# Patient Record
Sex: Female | Born: 1955
Health system: Southern US, Community
[De-identification: ages and names within clinical notes are randomized; demographics above are authoritative.]

## PROBLEM LIST (undated history)

## (undated) DIAGNOSIS — K219 Gastro-esophageal reflux disease without esophagitis: Secondary | ICD-10-CM

## (undated) DIAGNOSIS — C801 Malignant (primary) neoplasm, unspecified: Secondary | ICD-10-CM

## (undated) DIAGNOSIS — T7840XA Allergy, unspecified, initial encounter: Secondary | ICD-10-CM

## (undated) DIAGNOSIS — J45909 Unspecified asthma, uncomplicated: Secondary | ICD-10-CM

## (undated) DIAGNOSIS — J42 Unspecified chronic bronchitis: Secondary | ICD-10-CM

## (undated) DIAGNOSIS — T8859XA Other complications of anesthesia, initial encounter: Secondary | ICD-10-CM

## (undated) DIAGNOSIS — Z9889 Other specified postprocedural states: Secondary | ICD-10-CM

## (undated) DIAGNOSIS — G43909 Migraine, unspecified, not intractable, without status migrainosus: Secondary | ICD-10-CM

## (undated) DIAGNOSIS — R0600 Dyspnea, unspecified: Secondary | ICD-10-CM

## (undated) DIAGNOSIS — Z5189 Encounter for other specified aftercare: Secondary | ICD-10-CM

## (undated) DIAGNOSIS — R112 Nausea with vomiting, unspecified: Secondary | ICD-10-CM

## (undated) HISTORY — PX: OTHER SURGICAL HISTORY: SHX169

## (undated) HISTORY — DX: Unspecified chronic bronchitis: J42

## (undated) HISTORY — DX: Migraine, unspecified, not intractable, without status migrainosus: G43.909

## (undated) HISTORY — PX: CERVICAL FUSION: SHX112

## (undated) HISTORY — DX: Encounter for other specified aftercare: Z51.89

## (undated) HISTORY — PX: BACK SURGERY: SHX140

## (undated) HISTORY — DX: Allergy, unspecified, initial encounter: T78.40XA

## (undated) HISTORY — DX: Unspecified asthma, uncomplicated: J45.909

## (undated) HISTORY — DX: Gastro-esophageal reflux disease without esophagitis: K21.9

---

## 2005-06-23 ENCOUNTER — Ambulatory Visit: Payer: Self-pay | Admitting: Anesthesiology

## 2005-06-25 ENCOUNTER — Ambulatory Visit: Payer: Self-pay | Admitting: Anesthesiology

## 2005-08-19 ENCOUNTER — Ambulatory Visit: Payer: Self-pay | Admitting: Anesthesiology

## 2005-09-29 ENCOUNTER — Ambulatory Visit: Payer: Self-pay | Admitting: Anesthesiology

## 2005-10-23 ENCOUNTER — Ambulatory Visit: Payer: Self-pay | Admitting: Anesthesiology

## 2005-11-24 ENCOUNTER — Ambulatory Visit: Payer: Self-pay | Admitting: Anesthesiology

## 2005-12-23 ENCOUNTER — Ambulatory Visit: Payer: Self-pay | Admitting: Anesthesiology

## 2006-01-19 ENCOUNTER — Ambulatory Visit: Payer: Self-pay | Admitting: Anesthesiology

## 2006-02-17 ENCOUNTER — Ambulatory Visit: Payer: Self-pay | Admitting: Anesthesiology

## 2006-02-23 ENCOUNTER — Ambulatory Visit: Payer: Self-pay | Admitting: Anesthesiology

## 2006-02-26 ENCOUNTER — Ambulatory Visit: Payer: Self-pay | Admitting: Anesthesiology

## 2006-11-02 ENCOUNTER — Encounter: Payer: Self-pay | Admitting: Family Medicine

## 2006-11-20 ENCOUNTER — Encounter: Payer: Self-pay | Admitting: Family Medicine

## 2006-12-21 ENCOUNTER — Encounter: Payer: Self-pay | Admitting: Family Medicine

## 2007-01-20 ENCOUNTER — Encounter: Payer: Self-pay | Admitting: Family Medicine

## 2007-02-20 ENCOUNTER — Encounter: Payer: Self-pay | Admitting: Family Medicine

## 2007-03-22 ENCOUNTER — Encounter: Payer: Self-pay | Admitting: Family Medicine

## 2007-04-22 ENCOUNTER — Encounter: Payer: Self-pay | Admitting: Family Medicine

## 2008-09-21 HISTORY — PX: BACK SURGERY: SHX140

## 2010-03-03 ENCOUNTER — Ambulatory Visit: Payer: Self-pay | Admitting: Internal Medicine

## 2011-01-22 ENCOUNTER — Emergency Department: Payer: Self-pay | Admitting: Emergency Medicine

## 2011-02-23 ENCOUNTER — Ambulatory Visit: Payer: Self-pay | Admitting: Otolaryngology

## 2011-09-21 ENCOUNTER — Ambulatory Visit: Payer: Self-pay | Admitting: Orthopedic Surgery

## 2011-10-09 ENCOUNTER — Ambulatory Visit: Payer: Self-pay | Admitting: Orthopedic Surgery

## 2011-10-09 LAB — BASIC METABOLIC PANEL
BUN: 10 mg/dL (ref 7–18)
Chloride: 104 mmol/L (ref 98–107)
EGFR (Non-African Amer.): >60
Glucose: 101 mg/dL — ABNORMAL HIGH (ref 65–99)
Osmolality: 286 (ref 275–301)
Potassium: 4 mmol/L (ref 3.5–5.1)
Sodium: 144 mmol/L (ref 136–145)

## 2011-10-09 LAB — CBC
HCT: 41.4 % (ref 35.0–47.0)
MCHC: 33.9 g/dL (ref 32.0–36.0)
Platelet: 220 10*3/uL (ref 150–440)
RBC: 4.59 10*6/uL (ref 3.80–5.20)
RDW: 13.8 % (ref 11.5–14.5)
WBC: 7.3 10*3/uL (ref 3.6–11.0)

## 2011-10-09 LAB — APTT: Activated PTT: 31.4 secs (ref 23.6–35.9)

## 2011-10-09 LAB — SEDIMENTATION RATE: Erythrocyte Sed Rate: 2 mm/hr (ref 0–30)

## 2011-10-14 ENCOUNTER — Ambulatory Visit: Payer: Self-pay | Admitting: Orthopedic Surgery

## 2012-11-22 DIAGNOSIS — Z85828 Personal history of other malignant neoplasm of skin: Secondary | ICD-10-CM | POA: Insufficient documentation

## 2013-09-21 HISTORY — PX: ESOPHAGOGASTRODUODENOSCOPY: SHX1529

## 2014-01-03 ENCOUNTER — Ambulatory Visit: Payer: Self-pay | Admitting: Physical Medicine and Rehabilitation

## 2014-01-09 ENCOUNTER — Ambulatory Visit: Payer: Self-pay | Admitting: Internal Medicine

## 2014-02-16 ENCOUNTER — Ambulatory Visit: Payer: Self-pay | Admitting: Physical Medicine and Rehabilitation

## 2014-05-09 ENCOUNTER — Encounter: Payer: Self-pay | Admitting: Orthopedic Surgery

## 2014-05-22 ENCOUNTER — Encounter: Payer: Self-pay | Admitting: Orthopedic Surgery

## 2014-12-26 ENCOUNTER — Ambulatory Visit
Admit: 2014-12-26 | Disposition: A | Discharge status: Home / Self Care | Payer: Self-pay | Attending: Internal Medicine | Admitting: Internal Medicine

## 2015-01-03 ENCOUNTER — Ambulatory Visit
Admit: 2015-01-03 | Disposition: A | Discharge status: Home / Self Care | Payer: Self-pay | Attending: Internal Medicine | Admitting: Internal Medicine

## 2015-07-21 ENCOUNTER — Encounter: Payer: Self-pay | Admitting: Internal Medicine

## 2015-07-21 ENCOUNTER — Other Ambulatory Visit: Payer: Self-pay | Admitting: Internal Medicine

## 2015-07-21 DIAGNOSIS — K219 Gastro-esophageal reflux disease without esophagitis: Secondary | ICD-10-CM | POA: Insufficient documentation

## 2015-07-21 DIAGNOSIS — M5136 Other intervertebral disc degeneration, lumbar region: Secondary | ICD-10-CM | POA: Insufficient documentation

## 2015-07-21 DIAGNOSIS — J4520 Mild intermittent asthma, uncomplicated: Secondary | ICD-10-CM | POA: Insufficient documentation

## 2015-07-21 DIAGNOSIS — R1310 Dysphagia, unspecified: Secondary | ICD-10-CM | POA: Insufficient documentation

## 2015-07-21 DIAGNOSIS — M7070 Other bursitis of hip, unspecified hip: Secondary | ICD-10-CM | POA: Insufficient documentation

## 2015-08-06 ENCOUNTER — Other Ambulatory Visit: Payer: Self-pay | Admitting: Nurse Practitioner

## 2015-08-06 ENCOUNTER — Ambulatory Visit
Admission: RE | Admit: 2015-08-06 | Discharge: 2015-08-06 | Disposition: A | Discharge status: Home / Self Care | Payer: BLUE CROSS/BLUE SHIELD | Source: Ambulatory Visit | Attending: Nurse Practitioner | Admitting: Nurse Practitioner

## 2015-08-06 DIAGNOSIS — Z1231 Encounter for screening mammogram for malignant neoplasm of breast: Secondary | ICD-10-CM

## 2015-08-06 HISTORY — DX: Malignant (primary) neoplasm, unspecified: C80.1

## 2015-08-23 ENCOUNTER — Ambulatory Visit: Payer: BLUE CROSS/BLUE SHIELD | Attending: Internal Medicine | Admitting: Physical Therapy

## 2015-08-23 ENCOUNTER — Ambulatory Visit: Payer: BLUE CROSS/BLUE SHIELD | Admitting: Physical Therapy

## 2016-01-06 ENCOUNTER — Emergency Department
Admission: EM | Admit: 2016-01-06 | Discharge: 2016-01-06 | Disposition: A | Discharge status: Home / Self Care | Payer: BLUE CROSS/BLUE SHIELD | Attending: Emergency Medicine | Admitting: Emergency Medicine

## 2016-01-06 ENCOUNTER — Ambulatory Visit
Admission: EM | Admit: 2016-01-06 | Discharge: 2016-01-06 | Disposition: A | Discharge status: Hospice | Payer: BLUE CROSS/BLUE SHIELD | Attending: Emergency Medicine | Admitting: Emergency Medicine

## 2016-01-06 ENCOUNTER — Emergency Department: Payer: BLUE CROSS/BLUE SHIELD

## 2016-01-06 ENCOUNTER — Encounter: Payer: Self-pay | Admitting: Emergency Medicine

## 2016-01-06 DIAGNOSIS — S68122A Partial traumatic metacarpophalangeal amputation of right middle finger, initial encounter: Secondary | ICD-10-CM | POA: Diagnosis not present

## 2016-01-06 DIAGNOSIS — Y999 Unspecified external cause status: Secondary | ICD-10-CM | POA: Diagnosis not present

## 2016-01-06 DIAGNOSIS — Z85828 Personal history of other malignant neoplasm of skin: Secondary | ICD-10-CM | POA: Insufficient documentation

## 2016-01-06 DIAGNOSIS — S61312A Laceration without foreign body of right middle finger with damage to nail, initial encounter: Secondary | ICD-10-CM | POA: Diagnosis not present

## 2016-01-06 DIAGNOSIS — S6991XA Unspecified injury of right wrist, hand and finger(s), initial encounter: Secondary | ICD-10-CM | POA: Diagnosis present

## 2016-01-06 DIAGNOSIS — Y929 Unspecified place or not applicable: Secondary | ICD-10-CM | POA: Diagnosis not present

## 2016-01-06 DIAGNOSIS — X58XXXA Exposure to other specified factors, initial encounter: Secondary | ICD-10-CM | POA: Diagnosis not present

## 2016-01-06 DIAGNOSIS — S68629A Partial traumatic transphalangeal amputation of unspecified finger, initial encounter: Secondary | ICD-10-CM | POA: Diagnosis not present

## 2016-01-06 DIAGNOSIS — S68129A Partial traumatic metacarpophalangeal amputation of unspecified finger, initial encounter: Secondary | ICD-10-CM

## 2016-01-06 DIAGNOSIS — Y939 Activity, unspecified: Secondary | ICD-10-CM | POA: Insufficient documentation

## 2016-01-06 DIAGNOSIS — S68112A Complete traumatic metacarpophalangeal amputation of right middle finger, initial encounter: Secondary | ICD-10-CM | POA: Diagnosis not present

## 2016-01-06 DIAGNOSIS — S68119A Complete traumatic metacarpophalangeal amputation of unspecified finger, initial encounter: Secondary | ICD-10-CM

## 2016-01-06 MED ORDER — CEPHALEXIN 500 MG PO CAPS
500.0000 mg | ORAL_CAPSULE | Freq: Once | ORAL | Status: AC
  Administered 2016-01-06: 500 mg via ORAL
  Filled 2016-01-06: qty 1

## 2016-01-06 MED ORDER — OXYCODONE-ACETAMINOPHEN 5-325 MG PO TABS: 1.0000 | ORAL_TABLET | ORAL | Status: DC | PRN

## 2016-01-06 MED ORDER — TETANUS-DIPHTH-ACELL PERTUSSIS 5-2.5-18.5 LF-MCG/0.5 IM SUSP
0.5000 mL | Freq: Once | INTRAMUSCULAR | Status: AC
  Administered 2016-01-06: 0.5 mL via INTRAMUSCULAR

## 2016-01-06 MED ORDER — OXYCODONE-ACETAMINOPHEN 5-325 MG PO TABS
1.0000 | ORAL_TABLET | Freq: Once | ORAL | Status: AC
  Administered 2016-01-06: 1 via ORAL
  Filled 2016-01-06: qty 1

## 2016-01-06 MED ORDER — OXYCODONE-ACETAMINOPHEN 5-325 MG PO TABS
1.0000 | ORAL_TABLET | Freq: Once | ORAL | Status: AC
  Administered 2016-01-06: 1 via ORAL

## 2016-01-06 MED ORDER — CEPHALEXIN 500 MG PO CAPS: 500.0000 mg | ORAL_CAPSULE | Freq: Two times a day (BID) | ORAL | Status: DC

## 2016-01-06 MED ORDER — IBUPROFEN 600 MG PO TABS
600.0000 mg | ORAL_TABLET | Freq: Once | ORAL | Status: AC
  Administered 2016-01-06: 600 mg via ORAL
  Filled 2016-01-06: qty 1

## 2016-01-06 NOTE — Discharge Instructions (Signed)
You were evaluated for fingertip amputation/avulsion, and the finger was wrapped. Leave wrapped until seen in follow up.  You should follow-up with a hand surgeon, but I expect the fingertip will heal in without any additional surgical treatment.  Return to emergency department for any worsening pain, signs of infection such as redness, swelling, or foul drainage.  You may take over-the-counter ibuprofen in addition to the prescription pain medication as needed for pain.   Traumatic Finger Amputation A traumatic finger amputation is when you lose part or all of a finger because of an accident or injury. The severity of this type of injury can vary widely. It can mean that just the tip of your finger gets ripped off (avulsion), or it can mean that you completely lose a finger (amputation). Traumatic finger amputation is a medical emergency. It requires immediate care to prevent further damage and to save the finger. CAUSES A traumatic finger amputation usually results from an accident that involves:  A car.  Power tools.  Factory work.  Farm or Company secretary. SYMPTOMS  Symptoms of a traumatic finger amputation include:  Bleeding.  Pain.  Damage to surrounding tissues, such as bones, muscles, tendons, and skin. Severe injuries can sometimes lead to shock, which is a life-threatening condition in which your body fails to get blood, oxygen, and nutrients to vital organs. Some common symptoms of shock include low blood pressure, sweating, weakness, and pale skin. DIAGNOSIS Your health care provider will do a physical exam and ask for details about how your injury occurred. The physical exam will help your health care provider to determine the severity of the injury and the best way to repair it. X-rays may be done to check for damage to the surrounding bones and tissues. TREATMENT Treatment depends on the type of injury that you have and how bad it is.  Your health care provider will  clean the wound thoroughly and apply a medicine (anesthetic) to relieve pain.  If just the tip of your finger was removed, the wound will typically heal on its own with a protective bandage (dressing) and regular cleaning.  For more severe injuries, a portion of skin may need to be taken from another part of the body (graft) and attached to the wound site until the wound heals.  If a large portion of the finger was amputated, it may be possible to reattach it surgically (replantation). HOME CARE INSTRUCTIONS  Take medicines only as directed by your health care provider.  If you were prescribed an antibiotic medicine, finish all of it even if you start to feel better.  There are many different ways to close and cover a wound, including stitches (sutures), skin glue, and adhesive strips. Follow your health care provider's instructions about:  Wound care.  Dressing changes and removal.  Wound closure removal.  Check your wound every day for signs of infection. Watch for:  Redness, swelling, or pain.  Fluid, blood, or pus.  Do exercises to strengthen your finger and hand as directed by your health care provider. SEEK MEDICAL CARE IF:  Your wound does not seem to be healing well.  You have redness, swelling, or pain at your wound site.  You have fluid, blood, or pus coming from your wound.  You have a fever.   This information is not intended to replace advice given to you by your health care provider. Make sure you discuss any questions you have with your health care provider.   Document Released: 11/16/2001  Document Revised: 09/28/2014 Document Reviewed: 05/23/2014 Elsevier Interactive Patient Education [employer].

## 2016-01-06 NOTE — ED Notes (Signed)
Amputation of distal right rd finger.

## 2016-01-06 NOTE — ED Provider Notes (Signed)
[facility]  ____________________________________________  Time seen: Approximately 12:36 PM  I have reviewed the triage vital signs and the nursing notes.   HISTORY  Chief Complaint Finger Injury   HPI Kristin Watkins is a 60 y.o. female  presents for right hand third middle finger laceration. Reports injury occurred just prior to arrival. Reports right hand dominant. States she was using a planer tool working on her floors at home, and states her glove "got caught" in the tool and and cut her right third finger. Denies other pain or injury. States unsure of last tetanus immunization.   No LMP recorded. Patient is postmenopausal.    Past Medical History  Diagnosis Date  . Cancer [facility])     skin ca    Patient Active Problem List   Diagnosis Date Noted  . Bursitis of hip 07/21/2015  . Degeneration of intervertebral disc of lumbar region 07/21/2015  . Dysphagia 07/21/2015  . Asthma, mild intermittent 07/21/2015  . Acid reflux 07/21/2015    Past Surgical History  Procedure Laterality Date  . Back surgery      spinal stimulator  . Esophagogastroduodenoscopy  2015    normal    Current Outpatient Rx  Name  Route  Sig  Dispense  Refill  . albuterol (PROAIR HFA) 108 (90 BASE) MCG/ACT inhaler   Inhalation   Inhale into the lungs.         . celecoxib (CELEBREX) 100 MG capsule   Oral   Take 1 capsule by mouth 2 (two) times daily.         . diazepam (VALIUM) 2 MG tablet   Oral   Take by mouth.         . Diclofenac Potassium (CAMBIA) 50 MG PACK   Oral   Take 1 packet by mouth daily.         . diclofenac sodium (VOLTAREN) 1 % GEL   Transdermal   Place onto the skin.         . Estradiol (ELESTRIN) 0.52 MG/0.87 GM (0.06%) GEL   Transdermal   Place onto the skin.         Marland Kitchen gabapentin (NEURONTIN) 600 MG tablet   Oral   Take 2 tablets by mouth at bedtime.         . metFORMIN (GLUCOPHAGE) 500 MG tablet   Oral   Take 1 tablet by mouth  daily.         . mometasone (NASONEX) 50 MCG/ACT nasal spray   Nasal   Place into the nose.         Marland Kitchen Omeprazole 20 MG TBEC   Oral   Take 1 tablet by mouth 2 (two) times daily.         .           . progesterone (PROMETRIUM) 200 MG capsule   Oral   Take 1 capsule by mouth daily.           Allergies Review of patient's allergies indicates no known allergies.  Family History  Problem Relation Age of Onset  . Diabetes Father   . Renal cancer Father     Social History Social History  Substance Use Topics  . Smoking status: Never Smoker   . Smokeless tobacco: Not on file  . Alcohol Use: 0.0 oz/week    0 Standard drinks or equivalent per week    Review of Systems Constitutional: No fever/chills Eyes: No visual changes. ENT: No sore throat. Cardiovascular: Denies  chest pain. Respiratory: Denies shortness of breath. Gastrointestinal: No abdominal pain.  No nausea, no vomiting.  No diarrhea.  No constipation. Genitourinary: Negative for dysuria. Musculoskeletal: Negative for back pain. Skin: Negative for rash.Right third finger laceration.  Neurological: Negative for headaches, focal weakness or numbness.  10-point ROS otherwise negative.  ____________________________________________   PHYSICAL EXAM:  VITAL SIGNS: ED Triage Vitals  Enc Vitals Group     BP 01/06/16 1215 156/99 mmHg     Pulse Rate 01/06/16 1215 111     Resp 01/06/16 1215 16     Temp --      Temp src --      SpO2 01/06/16 1215 97 %     Weight --      Height --      Head Cir --      Peak Flow --      Pain Score --      Pain Loc --      Pain Edu? --      Excl. in [place]? --    Constitutional: Alert and oriented. Well appearing and in no acute distress. Eyes: Conjunctivae are normal. PERRL. EOMI. Head: Atraumatic. Cardiovascular: Normal rate, regular rhythm. Grossly normal heart sounds.  Good peripheral circulation. Respiratory: Normal respiratory effort.  No retractions. Lungs  CTAB. Gastrointestinal: Soft and nontender.  Musculoskeletal: No lower or upper extremity tenderness nor edema.  See skin below.  Neurologic:  Normal speech and language. No gross focal neurologic deficits are appreciated. No gait instability. Skin:  Skin is warm, dry and intact. No rash noted. Except: Right third distal finger laceration through proximal nailbed with appearance of bone visible, volar surface of distal right third finger appears generally intact, nail and skin missing. Volar sensation of right third finger intact. Full range of motion to right third finger with good strength. No foreign bodies visualized. Right hand otherwise nontender and skin appears intact. Actively bleeding at laceration site.  Psychiatric: Mood and affect are normal. Speech and behavior are normal.  ____________________________________________   LABS (all labs ordered are listed, but only abnormal results are displayed)  Labs Reviewed - No data to display  PROCEDURES  Procedure(s) performed:  Dressing applied. Elevated and applied ice.  _____________________   INITIAL IMPRESSION / ASSESSMENT AND PLAN / ED COURSE  Pertinent labs & imaging results that were available during my care of the patient were reviewed by me and considered in my medical decision making (see chart for details).  Presents for complaint of right third finger laceration. Right distal third finger laceration with partial distal amputation with appearance of bone visible, suspect open fracture. Recommend patient to be treated and further evaluated emergency room of her choice. Tetanus immunization updated. One Percocet orally given prior to transfer. Recommend patient to evaluated emergency room with hand specialty. Patient states that she would prefer to go to [facility] RN charge nurse on its regional called and report given. Patient friend at bedside to drive her. Patient stable at the time of transfer and bleeding  controlled.   Discussed follow up with Primary care physician this week. Discussed follow up and return parameters including no resolution or any worsening concerns. Patient verbalized understanding and agreed to plan.   ____________________________________________   FINAL CLINICAL IMPRESSION(S) / ED DIAGNOSES  Final diagnoses:  Laceration of right middle finger w/o foreign body with damage to nail, initial encounter      Note: This dictation was prepared with Dragon dictation along with smaller phrase  technology. Any transcriptional errors that result from this process are unintentional.    Marylene Land, NP 01/06/16 1313

## 2016-01-06 NOTE — Discharge Instructions (Signed)
Go directly to Emergency room as discussed.   Laceration Care, Adult A laceration is a cut that goes through all of the layers of the skin and into the tissue that is right under the skin. Some lacerations heal on their own. Others need to be closed with stitches (sutures), staples, skin adhesive strips, or skin glue. Proper laceration care minimizes the risk of infection and helps the laceration to heal better. HOW TO CARE FOR YOUR LACERATION If sutures or staples were used:  Keep the wound clean and dry.  If you were given a bandage (dressing), you should change it at least one time per day or as told by your health care provider. You should also change it if it becomes wet or dirty.  Keep the wound completely dry for the first 24 hours or as told by your health care provider. After that time, you may shower or bathe. However, make sure that the wound is not soaked in water until after the sutures or staples have been removed.  Clean the wound one time each day or as told by your health care provider:  Wash the wound with soap and water.  Rinse the wound with water to remove all soap.  Pat the wound dry with a clean towel. Do not rub the wound.  After cleaning the wound, apply a thin layer of antibiotic ointmentas told by your health care provider. This will help to prevent infection and keep the dressing from sticking to the wound.  Have the sutures or staples removed as told by your health care provider. If skin adhesive strips were used:  Keep the wound clean and dry.  If you were given a bandage (dressing), you should change it at least one time per day or as told by your health care provider. You should also change it if it becomes dirty or wet.  Do not get the skin adhesive strips wet. You may shower or bathe, but be careful to keep the wound dry.  If the wound gets wet, pat it dry with a clean towel. Do not rub the wound.  Skin adhesive strips fall off on their own. You  may trim the strips as the wound heals. Do not remove skin adhesive strips that are still stuck to the wound. They will fall off in time. If skin glue was used:  Try to keep the wound dry, but you may briefly wet it in the shower or bath. Do not soak the wound in water, such as by swimming.  After you have showered or bathed, gently pat the wound dry with a clean towel. Do not rub the wound.  Do not do any activities that will make you sweat heavily until the skin glue has fallen off on its own.  Do not apply liquid, cream, or ointment medicine to the wound while the skin glue is in place. Using those may loosen the film before the wound has healed.  If you were given a bandage (dressing), you should change it at least one time per day or as told by your health care provider. You should also change it if it becomes dirty or wet.  If a dressing is placed over the wound, be careful not to apply tape directly over the skin glue. Doing that may cause the glue to be pulled off before the wound has healed.  Do not pick at the glue. The skin glue usually remains in place for 5-10 days, then it falls off of  the skin. General Instructions  Take over-the-counter and prescription medicines only as told by your health care provider.  If you were prescribed an antibiotic medicine or ointment, take or apply it as told by your doctor. Do not stop using it even if your condition improves.  To help prevent scarring, make sure to cover your wound with sunscreen whenever you are outside after stitches are removed, after adhesive strips are removed, or when glue remains in place and the wound is healed. Make sure to wear a sunscreen of at least 30 SPF.  Do not scratch or pick at the wound.  Keep all follow-up visits as told by your health care provider. This is important.  Check your wound every day for signs of infection. Watch for:  Redness, swelling, or pain.  Fluid, blood, or pus.  Raise (elevate)  the injured area above the level of your heart while you are sitting or lying down, if possible. SEEK MEDICAL CARE IF:  You received a tetanus shot and you have swelling, severe pain, redness, or bleeding at the injection site.  You have a fever.  A wound that was closed breaks open.  You notice a bad smell coming from your wound or your dressing.  You notice something coming out of the wound, such as wood or glass.  Your pain is not controlled with medicine.  You have increased redness, swelling, or pain at the site of your wound.  You have fluid, blood, or pus coming from your wound.  You notice a change in the color of your skin near your wound.  You need to change the dressing frequently due to fluid, blood, or pus draining from the wound.  You develop a new rash.  You develop numbness around the wound. SEEK IMMEDIATE MEDICAL CARE IF:  You develop severe swelling around the wound.  Your pain suddenly increases and is severe.  You develop painful lumps near the wound or on skin that is anywhere on your body.  You have a red streak going away from your wound.  The wound is on your hand or foot and you cannot properly move a finger or toe.  The wound is on your hand or foot and you notice that your fingers or toes look pale or bluish.   This information is not intended to replace advice given to you by your health care provider. Make sure you discuss any questions you have with your health care provider.   Document Released: 09/07/2005 Document Revised: 01/22/2015 Document Reviewed: 09/03/2014 Elsevier Interactive Patient Education 2016 [employer]  Nail Bed Injury The nail bed is the soft tissue under a fingernail or toenail. Various types of injuries can occur at the nail bed. These may involve bruising or bleeding under the nail, cuts in the nail or nail bed, or loss of the nail or a part of it. It can take several months for a damaged nail to regrow. In some  cases, the nail may not grow back normally. HOME CARE  Raise (elevate) the injured part to lessen pain and puffiness (swelling).   For an injured toenail, lie with your leg on pillows. Avoid walking or letting your leg dangle. When you walk, wear an open-toe shoe.   For an injured fingernail, keep your hand above the level of your heart. Use pillows on a table or on the arm of your chair while sitting. Use pillows on your bed while sleeping.   Use bandages or splints as told by  your doctor.   Keep your bandage (dressing) clean and dry. Change your bandage as told by your doctor.   Only take medicine as told by your doctor.   See your doctor as needed. GET HELP RIGHT AWAY IF:   You have more pain, leaking fluid (drainage), or bleeding in the injured area.   You have redness, soreness, and puffiness (inflammation) in the injured area.  You have a fever or lasting symptoms for more than 2-3 days.  You have a fever and your symptoms suddenly get worse.  You have puffiness that spreads from your finger into your hand or from your toe into your foot.  MAKE SURE YOU:  Understand these instructions.  Will watch this condition.  Will get help right away if you are not doing well or get worse.   This information is not intended to replace advice given to you by your health care provider. Make sure you discuss any questions you have with your health care provider.   Document Released: 08/26/2009 Document Revised: 01/02/2013 Document Reviewed: 09/29/2012 Elsevier Interactive Patient Education [employer].

## 2016-01-06 NOTE — ED Notes (Addendum)
Pt sent over from [facility] for further eval of finger tip  amputation of right hand third digit. Pt recvd tetanus and had one percocet while seen at [facility]. Dressing  In place and bleeding controlled at this time.

## 2016-01-06 NOTE — ED Provider Notes (Signed)
[facility] Emergency Department Provider Note   ____________________________________________  Time seen: Approximately 3pm I have reviewed the triage vital signs and the triage nursing note.  HISTORY  Chief Complaint Hand Injury   Historian Patient  HPI Kristin Watkins is a 60 y.o. female who was sent in from Urgent care for evaluation of fingertip amputation/avulsion.  Prior to arrival, right middle finger got caught in planer and avulsed fingertip. Right hand dominant.  Pt is a Systems developer for [employer].  She received tetanus booster at urgent care.  Pain throbbing and moderate, received percocet at urgent care.    Past Medical History  Diagnosis Date  . Cancer [facility])     skin ca    Patient Active Problem List   Diagnosis Date Noted  . Bursitis of hip 07/21/2015  . Degeneration of intervertebral disc of lumbar region 07/21/2015  . Dysphagia 07/21/2015  . Asthma, mild intermittent 07/21/2015  . Acid reflux 07/21/2015    Past Surgical History  Procedure Laterality Date  . Back surgery      spinal stimulator  . Esophagogastroduodenoscopy  2015    normal    Current Outpatient Rx  Name  Route  Sig  Dispense  Refill  . albuterol (PROAIR HFA) 108 (90 BASE) MCG/ACT inhaler   Inhalation   Inhale into the lungs.         . celecoxib (CELEBREX) 100 MG capsule   Oral   Take 1 capsule by mouth 2 (two) times daily.         . cephALEXin (KEFLEX) 500 MG capsule   Oral   Take 1 capsule (500 mg total) by mouth 2 (two) times daily.   14 capsule   0   . diazepam (VALIUM) 2 MG tablet   Oral   Take by mouth.         . Diclofenac Potassium (CAMBIA) 50 MG PACK   Oral   Take 1 packet by mouth daily.         . diclofenac sodium (VOLTAREN) 1 % GEL   Transdermal   Place onto the skin.         . Estradiol (ELESTRIN) 0.52 MG/0.87 GM (0.06%) GEL   Transdermal   Place onto the skin.         Marland Kitchen gabapentin  (NEURONTIN) 600 MG tablet   Oral   Take 2 tablets by mouth at bedtime.         . metFORMIN (GLUCOPHAGE) 500 MG tablet   Oral   Take 1 tablet by mouth daily.         . mometasone (NASONEX) 50 MCG/ACT nasal spray   Nasal   Place into the nose.         Marland Kitchen Omeprazole 20 MG TBEC   Oral   Take 1 tablet by mouth 2 (two) times daily.         Marland Kitchen oxyCODONE-acetaminophen (ROXICET) 5-325 MG tablet   Oral   Take 1 tablet by mouth every 4 (four) hours as needed for severe pain.   10 tablet   0   . progesterone (PROMETRIUM) 200 MG capsule   Oral   Take 1 capsule by mouth daily.           Allergies Review of patient's allergies indicates no known allergies.  Family History  Problem Relation Age of Onset  . Diabetes Father   . Renal cancer Father     Social History Social History  Substance Use Topics  . Smoking status: Never Smoker   . Smokeless tobacco: None  . Alcohol Use: 0.0 oz/week    0 Standard drinks or equivalent per week    Review of Systems  Constitutional: No other injuries Eyes:  ENT:  Cardiovascular:  Respiratory:  Gastrointestinal:  Genitourinary:  Musculoskeletal: able to bend at joints of the finger Skin: Negative for rash Neurological: no numbness or tingling other than crush site, right middle finger  ____________________________________________   PHYSICAL EXAM:  VITAL SIGNS: ED Triage Vitals  Enc Vitals Group     BP 01/06/16 1301 157/83 mmHg     Pulse Rate 01/06/16 1301 99     Resp 01/06/16 1301 20     Temp 01/06/16 1301 98.1 F (36.7 C)     Temp Source 01/06/16 1301 Oral     SpO2 01/06/16 1301 95 %     Weight 01/06/16 1301 150 lb (68.04 kg)     Height 01/06/16 1301 5\' 5"  (1.651 m)     Head Cir --      Peak Flow --      Pain Score 01/06/16 1302 10     Pain Loc --      Pain Edu? --      Excl. in [place]? --      Constitutional: Alert and oriented. Well appearing and in no distress. HEENT   Head: Normocephalic and  atraumatic.      Eyes: Conjunctivae are normal. PERRL. Normal extraocular movements.      Ears:         Nose: No congestion/rhinnorhea.   Mouth/Throat: Mucous membranes are moist.   Neck: No stridor. Cardiovascular/Chest:  Respiratory: Normal respiratory effort without tachypnea. Gastrointestinal:   Genitourinary/rectal: Musculoskeletal: Right middle fingertip partial amputation obliquely tissue missing dorsally.  Fingertip crush injury appears dusky at the remaining area of the fingertip pulp.  Partial nail amputation, without injury to germinal matrix.  Amputation at tuft, able to move DIP and all other joints of the hand Neurologic:  Normal speech and language. No gross or focal neurologic deficits are appreciated. Skin:  Skin is warm, dry and intact. No rash noted. Psychiatric: Mood and affect are normal. Speech and behavior are normal. Patient exhibits appropriate insight and judgment.  ____________________________________________   EKG I, Lisa Roca, MD, the attending physician have personally viewed and interpreted all ECGs.  none ____________________________________________  LABS (pertinent positives/negatives)  None  ____________________________________________  RADIOLOGY All Xrays were viewed by me. Imaging interpreted by Radiologist.  Right middle finger:  IMPRESSION: 1. Absence of the tuft of the distal phalanx of the right middle finger compatible with amputation. It is difficult to tell how much of the surrounding soft tissues are amputated along with the loss of the tuft ; the soft tissues are not truncated directly at the bony margin. Surrounding bandaging noted. 2. Degenerative findings at the first carpometacarpal articulation. __________________________________________  PROCEDURES  Procedure(s) performed: Washout and splinting of right middle fingertip amputation.  Performed by myself, Dr. Reita Cliche M.D.  Betadine/saline solution irrigation of  injury.  Possible tiny amount of bone visualized.  Good cap refill to germinal matrix area of remaining nail.  Dusky discoloration to distal soft tissues of the tuft/fingerpad area.  Xeroform wrapped followed by dry gauze and aluminum fingersplint.  Critical Care performed: None  ____________________________________________   ED COURSE / ASSESSMENT AND PLAN  Pertinent labs & imaging results that were available during my care of the patient were reviewed by me and considered  in my medical decision making (see chart for details).   Her x-ray shows distal phalanx tuft amputation. On clinical exam there is no significant bony protrusion, and a fair amount of soft tissue is still there, although some of it appears dusky. I discussed with her the viability of that tissue was questionable, but for now we will let it started to heal and have her follow-up with her hand surgeon.  I don't think she needs emergency hand surgery.  CONSULTATIONS:   None  Patient / Family / Caregiver informed of clinical course, medical decision-making process, and agree with plan.   I discussed return precautions, follow-up instructions, and discharged instructions with patient and/or family.   ___________________________________________   FINAL CLINICAL IMPRESSION(S) / ED DIAGNOSES   Final diagnoses:  Fingertip amputation, initial encounter              Note: This dictation was prepared with Dragon dictation. Any transcriptional errors that result from this process are unintentional   Lisa Roca, MD 01/06/16 1554

## 2016-01-06 NOTE — ED Notes (Signed)
Pt got right third digit caught in planter box, fingernail is off with some flesh from the tip of finger, finger is oozing blood

## 2016-01-08 DIAGNOSIS — S68119A Complete traumatic metacarpophalangeal amputation of unspecified finger, initial encounter: Secondary | ICD-10-CM | POA: Diagnosis not present

## 2016-01-08 DIAGNOSIS — S62632B Displaced fracture of distal phalanx of right middle finger, initial encounter for open fracture: Secondary | ICD-10-CM | POA: Diagnosis not present

## 2016-01-15 DIAGNOSIS — Z4789 Encounter for other orthopedic aftercare: Secondary | ICD-10-CM | POA: Diagnosis not present

## 2016-01-15 DIAGNOSIS — S68114D Complete traumatic metacarpophalangeal amputation of right ring finger, subsequent encounter: Secondary | ICD-10-CM | POA: Diagnosis not present

## 2016-01-15 DIAGNOSIS — M79644 Pain in right finger(s): Secondary | ICD-10-CM | POA: Diagnosis not present

## 2016-01-15 DIAGNOSIS — S62632D Displaced fracture of distal phalanx of right middle finger, subsequent encounter for fracture with routine healing: Secondary | ICD-10-CM | POA: Diagnosis not present

## 2016-01-20 DIAGNOSIS — S62632D Displaced fracture of distal phalanx of right middle finger, subsequent encounter for fracture with routine healing: Secondary | ICD-10-CM | POA: Diagnosis not present

## 2016-01-20 DIAGNOSIS — Z4789 Encounter for other orthopedic aftercare: Secondary | ICD-10-CM | POA: Diagnosis not present

## 2016-01-20 DIAGNOSIS — M79644 Pain in right finger(s): Secondary | ICD-10-CM | POA: Diagnosis not present

## 2016-01-20 DIAGNOSIS — S68119D Complete traumatic metacarpophalangeal amputation of unspecified finger, subsequent encounter: Secondary | ICD-10-CM | POA: Diagnosis not present

## 2016-02-24 DIAGNOSIS — S62632D Displaced fracture of distal phalanx of right middle finger, subsequent encounter for fracture with routine healing: Secondary | ICD-10-CM | POA: Diagnosis not present

## 2016-02-24 DIAGNOSIS — Z4789 Encounter for other orthopedic aftercare: Secondary | ICD-10-CM | POA: Diagnosis not present

## 2016-03-11 DIAGNOSIS — Z872 Personal history of diseases of the skin and subcutaneous tissue: Secondary | ICD-10-CM | POA: Diagnosis not present

## 2016-03-11 DIAGNOSIS — D485 Neoplasm of uncertain behavior of skin: Secondary | ICD-10-CM | POA: Diagnosis not present

## 2016-03-11 DIAGNOSIS — Z08 Encounter for follow-up examination after completed treatment for malignant neoplasm: Secondary | ICD-10-CM | POA: Diagnosis not present

## 2016-03-11 DIAGNOSIS — Z85828 Personal history of other malignant neoplasm of skin: Secondary | ICD-10-CM | POA: Diagnosis not present

## 2016-03-11 DIAGNOSIS — Z1283 Encounter for screening for malignant neoplasm of skin: Secondary | ICD-10-CM | POA: Diagnosis not present

## 2016-04-07 DIAGNOSIS — L570 Actinic keratosis: Secondary | ICD-10-CM | POA: Diagnosis not present

## 2016-04-07 DIAGNOSIS — C44319 Basal cell carcinoma of skin of other parts of face: Secondary | ICD-10-CM | POA: Diagnosis not present

## 2016-04-10 DIAGNOSIS — M7541 Impingement syndrome of right shoulder: Secondary | ICD-10-CM | POA: Diagnosis not present

## 2016-04-10 DIAGNOSIS — M7061 Trochanteric bursitis, right hip: Secondary | ICD-10-CM | POA: Diagnosis not present

## 2016-05-07 DIAGNOSIS — M5030 Other cervical disc degeneration, unspecified cervical region: Secondary | ICD-10-CM | POA: Diagnosis not present

## 2016-05-07 DIAGNOSIS — M791 Myalgia: Secondary | ICD-10-CM | POA: Diagnosis not present

## 2016-05-07 DIAGNOSIS — M542 Cervicalgia: Secondary | ICD-10-CM | POA: Diagnosis not present

## 2016-05-07 DIAGNOSIS — G8929 Other chronic pain: Secondary | ICD-10-CM | POA: Diagnosis not present

## 2016-05-07 DIAGNOSIS — M5136 Other intervertebral disc degeneration, lumbar region: Secondary | ICD-10-CM | POA: Diagnosis not present

## 2016-05-20 DIAGNOSIS — R7309 Other abnormal glucose: Secondary | ICD-10-CM | POA: Diagnosis not present

## 2016-05-20 DIAGNOSIS — Z6825 Body mass index (BMI) 25.0-25.9, adult: Secondary | ICD-10-CM | POA: Diagnosis not present

## 2016-05-20 DIAGNOSIS — G4709 Other insomnia: Secondary | ICD-10-CM | POA: Diagnosis not present

## 2016-05-20 DIAGNOSIS — N959 Unspecified menopausal and perimenopausal disorder: Secondary | ICD-10-CM | POA: Diagnosis not present

## 2016-09-07 DIAGNOSIS — Z85828 Personal history of other malignant neoplasm of skin: Secondary | ICD-10-CM | POA: Diagnosis not present

## 2016-09-07 DIAGNOSIS — Z1283 Encounter for screening for malignant neoplasm of skin: Secondary | ICD-10-CM | POA: Diagnosis not present

## 2016-09-07 DIAGNOSIS — Z872 Personal history of diseases of the skin and subcutaneous tissue: Secondary | ICD-10-CM | POA: Diagnosis not present

## 2016-09-07 DIAGNOSIS — Z08 Encounter for follow-up examination after completed treatment for malignant neoplasm: Secondary | ICD-10-CM | POA: Diagnosis not present

## 2016-09-07 DIAGNOSIS — D485 Neoplasm of uncertain behavior of skin: Secondary | ICD-10-CM | POA: Diagnosis not present

## 2016-09-09 ENCOUNTER — Encounter: Payer: Self-pay | Admitting: Internal Medicine

## 2016-09-09 ENCOUNTER — Ambulatory Visit (INDEPENDENT_AMBULATORY_CARE_PROVIDER_SITE_OTHER): Payer: BLUE CROSS/BLUE SHIELD | Admitting: Internal Medicine

## 2016-09-09 VITALS — BP 118/82 | HR 86 | Temp 97.9°F | Ht 65.0 in

## 2016-09-09 DIAGNOSIS — J40 Bronchitis, not specified as acute or chronic: Secondary | ICD-10-CM | POA: Diagnosis not present

## 2016-09-09 DIAGNOSIS — J4520 Mild intermittent asthma, uncomplicated: Secondary | ICD-10-CM | POA: Diagnosis not present

## 2016-09-09 MED ORDER — BUDESONIDE 180 MCG/ACT IN AEPB: 1.0000 | INHALATION_SPRAY | Freq: Two times a day (BID) | RESPIRATORY_TRACT | 3 refills | Status: DC

## 2016-09-09 MED ORDER — DOXYCYCLINE HYCLATE 100 MG PO TABS: 100.0000 mg | ORAL_TABLET | Freq: Two times a day (BID) | ORAL | 0 refills | Status: DC

## 2016-09-09 MED ORDER — HYDROCODONE-HOMATROPINE 5-1.5 MG/5ML PO SYRP: 5.0000 mL | ORAL_SOLUTION | Freq: Four times a day (QID) | ORAL | 0 refills | Status: DC | PRN

## 2016-09-09 MED ORDER — BENZONATATE 200 MG PO CAPS: 200.0000 mg | ORAL_CAPSULE | Freq: Two times a day (BID) | ORAL | 0 refills | Status: DC | PRN

## 2016-09-09 NOTE — Progress Notes (Signed)
Date:  09/09/2016   Name:  Kristin Watkins   DOB:  09/13/1956   MRN:  [id number]   Chief Complaint: Cough Cough  This is a new problem. The current episode started in the past 7 days. The problem has been gradually worsening. The problem occurs every few minutes. The cough is non-productive. Associated symptoms include postnasal drip, a sore throat and wheezing. Pertinent negatives include no chest pain, chills, ear pain, headaches or shortness of breath. The symptoms are aggravated by exercise and lying down. She has tried a beta-agonist inhaler and steroid inhaler for the symptoms. The treatment provided mild relief. Her past medical history is significant for asthma.    Review of Systems  Constitutional: Positive for fatigue. Negative for appetite change, chills and unexpected weight change.  HENT: Positive for postnasal drip and sore throat. Negative for ear pain, tinnitus and trouble swallowing.   Respiratory: Positive for cough, chest tightness and wheezing. Negative for shortness of breath.   Cardiovascular: Negative for chest pain, palpitations and leg swelling.  Gastrointestinal: Negative for abdominal pain.  Musculoskeletal: Negative for arthralgias.  Neurological: Negative for tremors, numbness and headaches.  Psychiatric/Behavioral: Negative for dysphoric mood.    Patient Active Problem List   Diagnosis Date Noted  . Bursitis of hip 07/21/2015  . Degeneration of intervertebral disc of lumbar region 07/21/2015  . Dysphagia 07/21/2015  . Asthma, mild intermittent 07/21/2015  . Acid reflux 07/21/2015    Prior to Admission medications   Medication Sig Start Date End Date Taking? Authorizing Provider  albuterol (PROAIR HFA) 108 (90 BASE) MCG/ACT inhaler Inhale into the lungs. 12/12/14  Yes Historical Provider, MD  budesonide (PULMICORT) 180 MCG/ACT inhaler Inhale 1 puff into the lungs 2 (two) times daily. 09/09/16  Yes Glean Hess, MD  celecoxib (CELEBREX) 100 MG  capsule Take 1 capsule by mouth 2 (two) times daily.   Yes Historical Provider, MD  diazepam (VALIUM) 2 MG tablet Take by mouth.   Yes Historical Provider, MD  Diclofenac Potassium (CAMBIA) 50 MG PACK Take 1 packet by mouth daily.   Yes Historical Provider, MD  diclofenac sodium (VOLTAREN) 1 % GEL Place onto the skin. 03/22/14  Yes Historical Provider, MD  Estradiol (ELESTRIN) 0.52 MG/0.87 GM (0.06%) GEL Place onto the skin.   Yes Historical Provider, MD  gabapentin (NEURONTIN) 600 MG tablet Take 2 tablets by mouth at bedtime.   Yes Historical Provider, MD  metFORMIN (GLUCOPHAGE) 500 MG tablet Take 1 tablet by mouth daily.   Yes Historical Provider, MD  mometasone (NASONEX) 50 MCG/ACT nasal spray Place into the nose.   Yes Historical Provider, MD  progesterone (PROMETRIUM) 200 MG capsule Take 1 capsule by mouth daily.   Yes Historical Provider, MD  rizatriptan (MAXALT) 10 MG tablet TAKE 1 (ONE) TABLET, ORAL, EVERY TWO HOURS, AS NEEDED 04/01/15  Yes Historical Provider, MD  benzonatate (TESSALON) 200 MG capsule Take 1 capsule (200 mg total) by mouth 2 (two) times daily as needed for cough. 09/09/16   Glean Hess, MD  doxycycline (VIBRA-TABS) 100 MG tablet Take 1 tablet (100 mg total) by mouth 2 (two) times daily. 09/09/16   Glean Hess, MD  HYDROcodone-homatropine [facility]) 5-1.5 MG/5ML syrup Take 5 mLs by mouth every 6 (six) hours as needed for cough. 09/09/16   Glean Hess, MD    No Known Allergies  Past Surgical History:  Procedure Laterality Date  . BACK SURGERY     spinal stimulator  . ESOPHAGOGASTRODUODENOSCOPY  2015   normal    Social History  Substance Use Topics  . Smoking status: Never Smoker  . Smokeless tobacco: Never Used  . Alcohol use 0.0 oz/week     Medication list has been reviewed and updated.   Physical Exam  Constitutional: She is oriented to person, place, and time. She appears well-developed. No distress.  HENT:  Head: Normocephalic and  atraumatic.  Right Ear: Tympanic membrane and ear canal normal.  Left Ear: Tympanic membrane and ear canal normal.  Nose: Right sinus exhibits no maxillary sinus tenderness. Left sinus exhibits no maxillary sinus tenderness.  Mouth/Throat: Posterior oropharyngeal erythema present. No oropharyngeal exudate or posterior oropharyngeal edema.  Cardiovascular: Normal rate, regular rhythm and normal heart sounds.   Pulmonary/Chest: Effort normal. No respiratory distress. She has decreased breath sounds. She has wheezes in the right middle field and the left middle field. She has no rhonchi. She has no rales.  Musculoskeletal: Normal range of motion.  Neurological: She is alert and oriented to person, place, and time.  Skin: Skin is warm and dry. No rash noted.  Psychiatric: She has a normal mood and affect. Her behavior is normal. Thought content normal.  Nursing note and vitals reviewed.   BP 118/82   Pulse 86   Temp 97.9 F (36.6 C)   Ht 5\' 5"  (1.651 m)   SpO2 97%   Assessment and Plan: 1. Bronchitis Continue albuterol and pulmicort - doxycycline (VIBRA-TABS) 100 MG tablet; Take 1 tablet (100 mg total) by mouth 2 (two) times daily.  Dispense: 20 tablet; Refill: 0 - HYDROcodone-homatropine (HYCODAN) 5-1.5 MG/5ML syrup; Take 5 mLs by mouth every 6 (six) hours as needed for cough.  Dispense: 120 mL; Refill: 0 - benzonatate (TESSALON) 200 MG capsule; Take 1 capsule (200 mg total) by mouth 2 (two) times daily as needed for cough.  Dispense: 20 capsule; Refill: 0  2. Mild intermittent asthma without complication - budesonide (PULMICORT) 180 MCG/ACT inhaler; Inhale 1 puff into the lungs 2 (two) times daily.  Dispense: 1 each; Refill: [place], MD [facility]  09/09/2016

## 2016-10-12 DIAGNOSIS — D485 Neoplasm of uncertain behavior of skin: Secondary | ICD-10-CM | POA: Diagnosis not present

## 2016-10-12 DIAGNOSIS — D229 Melanocytic nevi, unspecified: Secondary | ICD-10-CM | POA: Diagnosis not present

## 2016-10-15 ENCOUNTER — Ambulatory Visit (INDEPENDENT_AMBULATORY_CARE_PROVIDER_SITE_OTHER): Payer: BLUE CROSS/BLUE SHIELD | Admitting: Internal Medicine

## 2016-10-15 ENCOUNTER — Encounter: Payer: Self-pay | Admitting: Internal Medicine

## 2016-10-15 VITALS — BP 132/84 | HR 88 | Temp 98.0°F | Ht 65.0 in | Wt 149.0 lb

## 2016-10-15 DIAGNOSIS — J309 Allergic rhinitis, unspecified: Secondary | ICD-10-CM | POA: Diagnosis not present

## 2016-10-15 DIAGNOSIS — K219 Gastro-esophageal reflux disease without esophagitis: Secondary | ICD-10-CM | POA: Diagnosis not present

## 2016-10-15 MED ORDER — LANSOPRAZOLE 30 MG PO CPDR: 30.0000 mg | DELAYED_RELEASE_CAPSULE | Freq: Every day | ORAL | 3 refills | Status: DC

## 2016-10-15 MED ORDER — MONTELUKAST SODIUM 10 MG PO TABS: 10.0000 mg | ORAL_TABLET | Freq: Every day | ORAL | 3 refills | Status: DC

## 2016-10-15 NOTE — Progress Notes (Signed)
Date:  10/15/2016   Name:  Kristin Watkins   DOB:  19-Oct-1955   MRN:  [id number]   Chief Complaint: Sinus Problem Sinus Problem  This is a new problem. The current episode started in the past 7 days. There has been no fever. Associated symptoms include congestion. Pertinent negatives include no chills, coughing, headaches, shortness of breath, sinus pressure or swollen glands. (Mostly post nasal drip) Treatments tried: claritin, nasonex.  Gastroesophageal Reflux  She complains of dysphagia and heartburn. She reports no abdominal pain, no chest pain, no coughing or no wheezing. This is a new problem. The heartburn is located in the substernum. Pertinent negatives include no fatigue. She has tried a PPI for the symptoms. Past procedures include an EGD and a UGI. scope normal; UGI normal - omeprazole has caused diarrhea and abdominal discomfort.    Review of Systems  Constitutional: Negative for chills, fatigue and fever.  HENT: Positive for congestion and trouble swallowing. Negative for sinus pressure.   Respiratory: Negative for cough, chest tightness, shortness of breath and wheezing.   Cardiovascular: Negative for chest pain and palpitations.  Gastrointestinal: Positive for diarrhea (frequent formed stools), dysphagia and heartburn. Negative for abdominal distention, abdominal pain and constipation.  Neurological: Negative for dizziness and headaches.    Patient Active Problem List   Diagnosis Date Noted  . Bursitis of hip 07/21/2015  . Degeneration of intervertebral disc of lumbar region 07/21/2015  . Dysphagia 07/21/2015  . Asthma, mild intermittent 07/21/2015  . Acid reflux 07/21/2015    Prior to Admission medications   Medication Sig Start Date End Date Taking? Authorizing Provider  albuterol (PROAIR HFA) 108 (90 BASE) MCG/ACT inhaler Inhale into the lungs. 12/12/14  Yes Historical Provider, MD  benzonatate (TESSALON) 200 MG capsule Take 1 capsule (200 mg total) by mouth 2  (two) times daily as needed for cough. 09/09/16  Yes Glean Hess, MD  budesonide (PULMICORT) 180 MCG/ACT inhaler Inhale 1 puff into the lungs 2 (two) times daily. 09/09/16  Yes Glean Hess, MD  celecoxib (CELEBREX) 100 MG capsule Take 1 capsule by mouth 2 (two) times daily.   Yes Historical Provider, MD  diazepam (VALIUM) 2 MG tablet Take by mouth.   Yes Historical Provider, MD  Diclofenac Potassium (CAMBIA) 50 MG PACK Take 1 packet by mouth daily.   Yes Historical Provider, MD  diclofenac sodium (VOLTAREN) 1 % GEL Place onto the skin. 03/22/14  Yes Historical Provider, MD  Estradiol (ELESTRIN) 0.52 MG/0.87 GM (0.06%) GEL Place onto the skin.   Yes Historical Provider, MD  gabapentin (NEURONTIN) 600 MG tablet Take 2 tablets by mouth at bedtime.   Yes Historical Provider, MD  metFORMIN (GLUCOPHAGE) 500 MG tablet Take 1 tablet by mouth daily.   Yes Historical Provider, MD  mometasone (NASONEX) 50 MCG/ACT nasal spray Place into the nose.   Yes Historical Provider, MD  omeprazole (PRILOSEC) 10 MG capsule Take 10 mg by mouth daily.   Yes Historical Provider, MD  progesterone (PROMETRIUM) 200 MG capsule Take 1 capsule by mouth daily.   Yes Historical Provider, MD  ranitidine (ZANTAC) 75 MG/5ML syrup Take by mouth 2 (two) times daily.   Yes Historical Provider, MD  rizatriptan (MAXALT) 10 MG tablet TAKE 1 (ONE) TABLET, ORAL, EVERY TWO HOURS, AS NEEDED 04/01/15  Yes Historical Provider, MD  HYDROcodone-homatropine (HYCODAN) 5-1.5 MG/5ML syrup Take 5 mLs by mouth every 6 (six) hours as needed for cough. Patient not taking: Reported on 10/15/2016 09/09/16  Glean Hess, MD    No Known Allergies  Past Surgical History:  Procedure Laterality Date  . BACK SURGERY     spinal stimulator  . ESOPHAGOGASTRODUODENOSCOPY  2015   normal    Social History  Substance Use Topics  . Smoking status: Never Smoker  . Smokeless tobacco: Never Used  . Alcohol use 0.0 oz/week     Medication list has  been reviewed and updated.   Physical Exam  Constitutional: She is oriented to person, place, and time. She appears well-developed. No distress.  HENT:  Head: Normocephalic and atraumatic.  Right Ear: Tympanic membrane and ear canal normal.  Left Ear: Tympanic membrane and ear canal normal.  Nose: Right sinus exhibits no maxillary sinus tenderness and no frontal sinus tenderness. Left sinus exhibits no maxillary sinus tenderness and no frontal sinus tenderness.  Cardiovascular: Normal rate, regular rhythm and normal heart sounds.   Pulmonary/Chest: Effort normal and breath sounds normal. No respiratory distress.  Musculoskeletal: Normal range of motion.  Neurological: She is alert and oriented to person, place, and time.  Skin: Skin is warm and dry. No rash noted.  Psychiatric: She has a normal mood and affect. Her behavior is normal. Thought content normal.  Nursing note and vitals reviewed.   BP 132/84   Pulse 88   Temp 98 F (36.7 C)   Ht 5\' 5"  (1.651 m)   Wt 149 lb (67.6 kg)   SpO2 98%   BMI 24.79 kg/m   Assessment and Plan: 1. Gastroesophageal reflux disease without esophagitis Stop omeprazole; switch PPI Continue probiotics - lansoprazole (PREVACID) 30 MG capsule; Take 1 capsule (30 mg total) by mouth daily at 12 noon.  Dispense: 30 capsule; Refill: 3  2. Allergic rhinitis, unspecified chronicity, unspecified seasonality, unspecified trigger Add singulair Continue claritin - montelukast (SINGULAIR) 10 MG tablet; Take 1 tablet (10 mg total) by mouth at bedtime.  Dispense: 30 tablet; Refill: [place], MD [facility]  10/15/2016

## 2016-11-12 ENCOUNTER — Other Ambulatory Visit: Payer: Self-pay

## 2016-11-12 ENCOUNTER — Other Ambulatory Visit: Payer: Self-pay | Admitting: Internal Medicine

## 2016-11-12 DIAGNOSIS — K219 Gastro-esophageal reflux disease without esophagitis: Secondary | ICD-10-CM

## 2016-11-12 DIAGNOSIS — J309 Allergic rhinitis, unspecified: Secondary | ICD-10-CM

## 2016-11-12 MED ORDER — LANSOPRAZOLE 30 MG PO CPDR: 30.0000 mg | DELAYED_RELEASE_CAPSULE | Freq: Every day | ORAL | 3 refills | Status: DC

## 2016-11-12 MED ORDER — MONTELUKAST SODIUM 10 MG PO TABS: 10.0000 mg | ORAL_TABLET | Freq: Every day | ORAL | 3 refills | Status: DC

## 2016-11-13 ENCOUNTER — Other Ambulatory Visit: Payer: Self-pay | Admitting: Internal Medicine

## 2016-11-13 DIAGNOSIS — J309 Allergic rhinitis, unspecified: Secondary | ICD-10-CM

## 2016-11-13 MED ORDER — MONTELUKAST SODIUM 10 MG PO TABS: 10.0000 mg | ORAL_TABLET | Freq: Every day | ORAL | 1 refills | Status: DC

## 2016-12-03 ENCOUNTER — Telehealth: Payer: Self-pay

## 2016-12-03 ENCOUNTER — Other Ambulatory Visit: Payer: Self-pay | Admitting: Internal Medicine

## 2016-12-03 ENCOUNTER — Encounter: Payer: Self-pay | Admitting: Medical

## 2016-12-03 ENCOUNTER — Ambulatory Visit: Payer: Self-pay | Admitting: Medical

## 2016-12-03 VITALS — BP 138/90 | HR 96 | Temp 99.0°F | Resp 16 | Ht 66.0 in | Wt 150.0 lb

## 2016-12-03 DIAGNOSIS — B029 Zoster without complications: Secondary | ICD-10-CM

## 2016-12-03 MED ORDER — VALACYCLOVIR HCL 1 G PO TABS: 1000.0000 mg | ORAL_TABLET | Freq: Three times a day (TID) | ORAL | 0 refills | Status: DC

## 2016-12-03 NOTE — Telephone Encounter (Signed)
Pt called stating that she believes she is coming up with shingles. 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 Will still try to schedule appt but in mean time would like to try and get antiviral medication sent in to [place]. Need appt first?

## 2016-12-03 NOTE — Telephone Encounter (Signed)
Rx for Valtrex sent.

## 2016-12-03 NOTE — Telephone Encounter (Signed)
Informed pt. Pt said [facility] is seeing her this afternoon. If they confirm shingles, she may not come tomorrow for appt. If any questions, she will still see Dr. Army Melia tomorrow.

## 2016-12-03 NOTE — Progress Notes (Signed)
   Subjective:    Patient ID: Kristin Watkins, female    DOB: [birth year], 61 y.o.   MRN: [id number]  HPI Noticed rash yesterday, felt discomfort and itching over the weekend.  Called her doctor  Dr. Army Melia in [facility] who was unaable to see the patient today but called for the patient  in some Valtrex. But patient  wanted to get rash examined. Has pain medication at home so says she needs none. A nursing friend of hers told her it sounded like  She had shingles. 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.    Review of Systems  Constitutional: Positive for fever.  in clinic. itchty rash on right side at bra line, with discomfort.   Recent illness with URI in the fall and then a mild pneumonia around christmas time. Placed on antibiotics x 10 days and then got better. See HPI     Objective:   Physical Exam  Constitutional: She appears well-developed and well-nourished.  HENT:  Head: Normocephalic and atraumatic.  Eyes: EOM are normal. Pupils are equal, round, and reactive to light.  Skin: Skin is warm and dry. Rash noted.   blistering rash on right side with erythematous base located under braline and 2 small areas on back at same dermatome. Does not cross midline.         Assessment & Plan:  Start Valtrex today. Return to clinic if worsening or see your family doctor in the next 3-5 days. Take pain medication as needed.

## 2016-12-04 ENCOUNTER — Ambulatory Visit: Payer: BLUE CROSS/BLUE SHIELD | Admitting: Internal Medicine

## 2017-03-01 ENCOUNTER — Other Ambulatory Visit: Payer: Self-pay | Admitting: Internal Medicine

## 2017-03-17 ENCOUNTER — Ambulatory Visit (INDEPENDENT_AMBULATORY_CARE_PROVIDER_SITE_OTHER): Payer: BLUE CROSS/BLUE SHIELD | Admitting: Internal Medicine

## 2017-03-17 ENCOUNTER — Other Ambulatory Visit: Payer: Self-pay | Admitting: Internal Medicine

## 2017-03-17 ENCOUNTER — Encounter: Payer: Self-pay | Admitting: Internal Medicine

## 2017-03-17 VITALS — BP 122/72 | HR 88 | Ht 65.0 in | Wt 156.0 lb

## 2017-03-17 DIAGNOSIS — C44319 Basal cell carcinoma of skin of other parts of face: Secondary | ICD-10-CM | POA: Diagnosis not present

## 2017-03-17 DIAGNOSIS — D485 Neoplasm of uncertain behavior of skin: Secondary | ICD-10-CM | POA: Diagnosis not present

## 2017-03-17 DIAGNOSIS — K219 Gastro-esophageal reflux disease without esophagitis: Secondary | ICD-10-CM | POA: Diagnosis not present

## 2017-03-17 DIAGNOSIS — Z86018 Personal history of other benign neoplasm: Secondary | ICD-10-CM | POA: Diagnosis not present

## 2017-03-17 DIAGNOSIS — J4520 Mild intermittent asthma, uncomplicated: Secondary | ICD-10-CM | POA: Diagnosis not present

## 2017-03-17 DIAGNOSIS — Z85828 Personal history of other malignant neoplasm of skin: Secondary | ICD-10-CM | POA: Diagnosis not present

## 2017-03-17 DIAGNOSIS — L918 Other hypertrophic disorders of the skin: Secondary | ICD-10-CM | POA: Diagnosis not present

## 2017-03-17 MED ORDER — LANSOPRAZOLE 30 MG PO CPDR: 30.0000 mg | DELAYED_RELEASE_CAPSULE | Freq: Every day | ORAL | 5 refills | Status: DC

## 2017-03-17 NOTE — Progress Notes (Signed)
Date:  03/17/2017   Name:  Kristin Watkins   DOB:  1956/07/30   MRN:  [id number]   Chief Complaint: Asthma (Seems like last few weeks been using rescue inhaler more. Humidity is causing asthma to be worse. Yesterday there was donuts at front of class. Had asthma attack after they were brought into room- Coughing so bad thought was going to vomit. Had to use rescue inhaler. Had to leave class to go home and use nebulizer twice. Still having difficulty catching breath. Had to use nebulizer once this morning- Tightness in chest when happening. Headache along with post nasal drip is other symptoms. ) and Gastroesophageal Reflux (Belching all night long. Having trouble sleeping due to this. Even after eating crackers still having belching issues. Wanting medication she can take daily for GERD issues instead of PREVACID.   )    Review of Systems  Constitutional: Negative for chills, fatigue and fever.  Respiratory: Positive for chest tightness, shortness of breath and wheezing.   Cardiovascular: Negative for chest pain and palpitations.  Gastrointestinal: Negative for nausea and vomiting.       Reflux - resolved after prevacid but then read not to take indefinitely so stopped it several weeks ago  Allergic/Immunologic: Positive for environmental allergies.  Neurological: Positive for headaches. Negative for dizziness.    Patient Active Problem List   Diagnosis Date Noted  . Allergic rhinitis 10/15/2016  . Bursitis of hip 07/21/2015  . Degeneration of intervertebral disc of lumbar region 07/21/2015  . Dysphagia 07/21/2015  . Asthma, mild intermittent 07/21/2015  . Acid reflux 07/21/2015  . Personal history of other malignant neoplasm of skin 11/22/2012    Prior to Admission medications   Medication Sig Start Date End Date Taking? Authorizing Provider  budesonide (PULMICORT) 180 MCG/ACT inhaler Inhale 1 puff into the lungs 2 (two) times daily. 09/09/16  Yes Glean Hess, MD    diazepam (VALIUM) 2 MG tablet Take by mouth.   Yes [provider]  Diclofenac Potassium (CAMBIA) 50 MG PACK Take 1 packet by mouth daily.   Yes [provider]  diclofenac sodium (VOLTAREN) 1 % GEL Place onto the skin. 03/22/14  Yes [provider]  Estradiol (ELESTRIN) 0.52 MG/0.87 GM (0.06%) GEL Place onto the skin.   Yes [provider]  gabapentin (NEURONTIN) 600 MG tablet Take 150 mg by mouth at bedtime.    Yes [provider]  lansoprazole (PREVACID) 30 MG capsule TAKE 1 CAPSULE (30 MG TOTAL) BY MOUTH DAILY AT 12 NOON. 11/12/16  Yes Glean Hess, MD  montelukast (SINGULAIR) 10 MG tablet Take 1 tablet (10 mg total) by mouth at bedtime. 11/13/16  Yes Glean Hess, MD  PROAIR HFA 108 [phone] Base) MCG/ACT inhaler INHALE 2 PUFFS 4 TIMES DAILY AS NEEDED 03/01/17  Yes Glean Hess, MD  progesterone (PROMETRIUM) 200 MG capsule Take 1 capsule by mouth daily.   Yes [provider]  rizatriptan (MAXALT) 10 MG tablet TAKE 1 (ONE) TABLET, ORAL, EVERY TWO HOURS, AS NEEDED 04/01/15  Yes [provider]  Testosterone 10 MG/ACT (2%) GEL Place onto the skin.   Yes [provider]  traMADol-acetaminophen (ULTRACET) 37.5-325 MG tablet TAKE 1-2 TABLETS BY MOUTH TWICE DAILY AS NEEDED 09/24/16  Yes [provider]    No Known Allergies  Past Surgical History:  Procedure Laterality Date  . BACK SURGERY     spinal stimulator  . ESOPHAGOGASTRODUODENOSCOPY  2015   normal  Social History  Substance Use Topics  . Smoking status: Never Smoker  . Smokeless tobacco: Never Used  . Alcohol use 0.0 oz/week     Medication list has been reviewed and updated.   Physical Exam  Constitutional: She is oriented to person, place, and time. She appears well-developed. No distress.  HENT:  Head: Normocephalic and atraumatic.  Neck: Normal range of motion. Neck supple. No thyromegaly present.  Pulmonary/Chest: Effort normal. No  respiratory distress. She has no wheezes.  Musculoskeletal: Normal range of motion.  Neurological: She is alert and oriented to person, place, and time.  Skin: Skin is warm and dry. No rash noted.  Psychiatric: She has a normal mood and affect. Her behavior is normal. Thought content normal.  Nursing note and vitals reviewed.   BP 122/72   Pulse 88   Ht 5\' 5"  (1.651 m)   Wt 156 lb (70.8 kg)   SpO2 99%   BMI 25.96 kg/m   Assessment and Plan: 1. Mild intermittent asthma without complication Begin Symbicort 160/4.5 bid in place of Pulmicort for the next month Resume Prevacid Continue albuterol as needed Rx for portable travel sized nebulizer given  2. Gastroesophageal reflux disease without esophagitis - lansoprazole (PREVACID) 30 MG capsule; Take 1 capsule (30 mg total) by mouth daily at 12 noon.  Dispense: 30 capsule; Refill: 5   Meds ordered this encounter  Medications  . lansoprazole (PREVACID) 30 MG capsule    Sig: Take 1 capsule (30 mg total) by mouth daily at 12 noon.    Dispense:  30 capsule    Refill:  [place], MD [facility]  03/17/2017

## 2017-03-17 NOTE — Patient Instructions (Signed)
Resume Prevacid daily  Hold Pulmicort for 2 weeks while using Symbicort 160/4.5 - One puff twice a day.

## 2017-03-31 DIAGNOSIS — C44319 Basal cell carcinoma of skin of other parts of face: Secondary | ICD-10-CM | POA: Diagnosis not present

## 2017-05-22 ENCOUNTER — Other Ambulatory Visit: Payer: Self-pay | Admitting: Internal Medicine

## 2017-06-08 DIAGNOSIS — M7541 Impingement syndrome of right shoulder: Secondary | ICD-10-CM | POA: Diagnosis not present

## 2017-06-09 ENCOUNTER — Other Ambulatory Visit: Payer: Self-pay | Admitting: Internal Medicine

## 2017-06-09 DIAGNOSIS — J309 Allergic rhinitis, unspecified: Secondary | ICD-10-CM

## 2017-06-15 ENCOUNTER — Ambulatory Visit (INDEPENDENT_AMBULATORY_CARE_PROVIDER_SITE_OTHER): Payer: BLUE CROSS/BLUE SHIELD | Admitting: Internal Medicine

## 2017-06-15 ENCOUNTER — Encounter: Payer: Self-pay | Admitting: Internal Medicine

## 2017-06-15 VITALS — BP 140/86 | HR 93 | Ht 65.0 in | Wt 140.8 lb

## 2017-06-15 DIAGNOSIS — Z1159 Encounter for screening for other viral diseases: Secondary | ICD-10-CM | POA: Diagnosis not present

## 2017-06-15 DIAGNOSIS — K219 Gastro-esophageal reflux disease without esophagitis: Secondary | ICD-10-CM | POA: Diagnosis not present

## 2017-06-15 DIAGNOSIS — R7303 Prediabetes: Secondary | ICD-10-CM | POA: Diagnosis not present

## 2017-06-15 DIAGNOSIS — E782 Mixed hyperlipidemia: Secondary | ICD-10-CM

## 2017-06-15 DIAGNOSIS — M5136 Other intervertebral disc degeneration, lumbar region: Secondary | ICD-10-CM | POA: Diagnosis not present

## 2017-06-15 DIAGNOSIS — M51369 Other intervertebral disc degeneration, lumbar region without mention of lumbar back pain or lower extremity pain: Secondary | ICD-10-CM

## 2017-06-15 DIAGNOSIS — N951 Menopausal and female climacteric states: Secondary | ICD-10-CM | POA: Diagnosis not present

## 2017-06-15 DIAGNOSIS — Z1211 Encounter for screening for malignant neoplasm of colon: Secondary | ICD-10-CM

## 2017-06-15 MED ORDER — PROGESTERONE MICRONIZED 200 MG PO CAPS: 200.0000 mg | ORAL_CAPSULE | Freq: Every day | ORAL | 3 refills | Status: DC

## 2017-06-15 MED ORDER — ESTRADIOL 0.52 MG/0.87 GM (0.06%) TD GEL: 1.0000 "application " | Freq: Every day | TRANSDERMAL | 3 refills | Status: DC

## 2017-06-15 NOTE — Progress Notes (Signed)
Date:  06/15/2017   Name:  Kristin Watkins   DOB:  06-02-1956   MRN:  [id number]   Chief Complaint: hormone replacement  (Want to discuss getting blood work and prescribing hormone replacemnt. It currently on treatment. NP- Teague. )  Menopausal sx - she has been seen in integrative physician for a number of years for hormonal therapy. That practitioner though his been moving around frequently and she is wondering if she can get her prescriptions here. She is on topical estrogen and testosterone and oral progesterone with excellent symptom control.  Colon cancer screening - she is due for colonoscopy. Has never had one and now that she 76 she thinks this time.  Pre-diabetes - she's had several borderline A1c is over the past several years. This was previously being monitored by the alternative practitioner. Review of Systems  Constitutional: Negative for chills, fatigue, fever and unexpected weight change.  Eyes: Negative for visual disturbance.  Respiratory: Negative for chest tightness and shortness of breath.   Cardiovascular: Negative for chest pain and leg swelling.  Endocrine: Positive for polyuria. Negative for polydipsia.  Genitourinary: Negative for menstrual problem, vaginal bleeding and vaginal pain.  Musculoskeletal: Positive for arthralgias and back pain.  Skin: Negative for wound.  Allergic/Immunologic: Negative for environmental allergies.  Neurological: Negative for dizziness and headaches.  Psychiatric/Behavioral: Negative for sleep disturbance.    Patient Active Problem List   Diagnosis Date Noted  . Allergic rhinitis 10/15/2016  . Bursitis of hip 07/21/2015  . Degeneration of intervertebral disc of lumbar region 07/21/2015  . Dysphagia 07/21/2015  . Asthma, mild intermittent 07/21/2015  . Acid reflux 07/21/2015  . Personal history of other malignant neoplasm of skin 11/22/2012    Prior to Admission medications   Medication Sig  Start Date End Date Taking? Authorizing Provider  albuterol (PROAIR HFA) 108 (90 Base) MCG/ACT inhaler INHALE 2 PUFFS 4 TIMES DAILY AS NEEDED 05/22/17  Yes Glean Hess, MD  budesonide (PULMICORT) 180 MCG/ACT inhaler Inhale 1 puff into the lungs 2 (two) times daily. 09/09/16  Yes Glean Hess, MD  diazepam (VALIUM) 2 MG tablet Take by mouth.   Yes [provider]  Diclofenac Potassium (CAMBIA) 50 MG PACK Take 1 packet by mouth daily.   Yes [provider]  Estradiol (ELESTRIN) 0.52 MG/0.87 GM (0.06%) GEL Place onto the skin.   Yes [provider]  lansoprazole (PREVACID) 30 MG capsule Take 1 capsule (30 mg total) by mouth daily at 12 noon. 03/17/17  Yes Glean Hess, MD  montelukast (SINGULAIR) 10 MG tablet TAKE 1 TABLET (10 MG TOTAL) BY MOUTH AT BEDTIME. 06/09/17  Yes Glean Hess, MD  progesterone (PROMETRIUM) 200 MG capsule Take 1 capsule by mouth daily.   Yes [provider]  Testosterone 10 MG/ACT (2%) GEL Place onto the skin.   Yes [provider]  traMADol-acetaminophen (ULTRACET) 37.5-325 MG tablet TAKE 1-2 TABLETS BY MOUTH TWICE DAILY AS NEEDED 09/24/16  Yes [provider]  diclofenac sodium (VOLTAREN) 1 % GEL Place onto the skin. 03/22/14   [provider]  rizatriptan (MAXALT) 10 MG tablet TAKE 1 (ONE) TABLET, ORAL, EVERY TWO HOURS, AS NEEDED 04/01/15   [provider]    No Known Allergies  Past Surgical History:  Procedure Laterality Date  . BACK SURGERY     spinal stimulator  . ESOPHAGOGASTRODUODENOSCOPY  2015   normal    Social History  Substance Use Topics  .  Smoking status: Never Smoker  . Smokeless tobacco: Never Used  . Alcohol use 0.0 oz/week     Medication list has been reviewed and updated.  PHQ 2/9 Scores 06/15/2017  PHQ - 2 Score 0    Physical Exam  Constitutional: She is oriented to person, place, and time. She appears well-developed. No distress.  HENT:  Head:  Normocephalic and atraumatic.  Neck: Normal range of motion. Neck supple. No thyromegaly present.  Cardiovascular: Normal rate, regular rhythm and normal heart sounds.   Pulmonary/Chest: Effort normal and breath sounds normal. No respiratory distress. She has no wheezes.  Musculoskeletal: She exhibits no edema.  Neurological: She is alert and oriented to person, place, and time.  Skin: Skin is warm and dry. No rash noted.  Psychiatric: She has a normal mood and affect. Her behavior is normal. Thought content normal.  Nursing note and vitals reviewed.   BP 140/86 (BP Location: Right Arm, Patient Position: Sitting, Cuff Size: Normal)   Pulse 93   Ht 5\' 5"  (1.651 m)   Wt 140 lb 12.8 oz (63.9 kg)   SpO2 99%   BMI 23.43 kg/m   Assessment and Plan: 1. Climacteric Continue current therapy Will ask [facility] to send over fax for testosterone gel - Estradiol (ELESTRIN) 0.52 MG/0.87 GM (0.06%) GEL; Apply 1 application topically daily.  Dispense: 52 g; Refill: 3 - Comprehensive metabolic panel  2. Gastroesophageal reflux disease without esophagitis Continue PPI - Comprehensive metabolic panel - TSH  3. Degeneration of intervertebral disc of lumbar region stable  4. Colon cancer screening - Ambulatory referral to Gastroenterology  5. Mixed hyperlipidemia - Lipid panel  6. Pre-diabetes - Hemoglobin A1c  7. Need for hepatitis C screening test - Hepatitis C antibody   Meds ordered this encounter  Medications  . Estradiol (ELESTRIN) 0.52 MG/0.87 GM (0.06%) GEL    Sig: Apply 1 application topically daily.    Dispense:  52 g    Refill:  3  . progesterone (PROMETRIUM) 200 MG capsule    Sig: Take 1 capsule (200 mg total) by mouth daily.    Dispense:  90 capsule    Refill:  3    Partially dictated using Editor, commissioning. Any errors are unintentional.  Halina Maidens, MD [facility]  06/15/2017

## 2017-06-16 LAB — COMPREHENSIVE METABOLIC PANEL
ALK PHOS: 65 IU/L (ref 39–117)
ALT: 11 IU/L (ref 0–32)
AST: 15 IU/L (ref 0–40)
Albumin/Globulin Ratio: 2.1 (ref 1.2–2.2)
Albumin: 5.1 g/dL (ref 3.5–5.5)
BUN/Creatinine Ratio: 14 (ref 9–23)
BUN: 13 mg/dL (ref 6–24)
Bilirubin Total: 0.5 mg/dL (ref 0.0–1.2)
CALCIUM: 10.1 mg/dL (ref 8.7–10.2)
CO2: 25 mmol/L (ref 20–29)
CREATININE: 0.92 mg/dL (ref 0.57–1.00)
Chloride: 100 mmol/L (ref 96–106)
GFR calc Af Amer: 79 mL/min/{1.73_m2} (ref >59)
GFR, EST NON AFRICAN AMERICAN: 68 mL/min/{1.73_m2} (ref >59)
GLOBULIN, TOTAL: 2.4 g/dL (ref 1.5–4.5)
Glucose: 91 mg/dL (ref 65–99)
POTASSIUM: 4.1 mmol/L (ref 3.5–5.2)
SODIUM: 141 mmol/L (ref 134–144)
Total Protein: 7.5 g/dL (ref 6.0–8.5)

## 2017-06-16 LAB — LIPID PANEL
CHOL/HDL RATIO: 3.2 ratio (ref 0.0–4.4)
CHOLESTEROL TOTAL: 234 mg/dL — AB (ref 100–199)
HDL: 73 mg/dL (ref >39)
LDL CALC: 141 mg/dL — AB (ref 0–99)
TRIGLYCERIDES: 102 mg/dL (ref 0–149)
VLDL Cholesterol Cal: 20 mg/dL (ref 5–40)

## 2017-06-16 LAB — HEMOGLOBIN A1C
Est. average glucose Bld gHb Est-mCnc: 120 mg/dL
Hgb A1c MFr Bld: 5.8 % — ABNORMAL HIGH (ref 4.8–5.6)

## 2017-06-16 LAB — TSH: TSH: 1.31 u[IU]/mL (ref 0.450–4.500)

## 2017-06-16 LAB — HEPATITIS C ANTIBODY

## 2017-07-16 ENCOUNTER — Other Ambulatory Visit: Payer: Self-pay

## 2017-07-16 ENCOUNTER — Telehealth: Payer: Self-pay

## 2017-07-16 DIAGNOSIS — Z1211 Encounter for screening for malignant neoplasm of colon: Secondary | ICD-10-CM

## 2017-07-16 DIAGNOSIS — Z1212 Encounter for screening for malignant neoplasm of rectum: Principal | ICD-10-CM

## 2017-07-16 NOTE — Telephone Encounter (Signed)
Gastroenterology Pre-Procedure Review  Request Date: 11/1 Requesting Physician: Dr. Allen Norris  PATIENT REVIEW QUESTIONS: The patient responded to the following health history questions as indicated:    1. Are you having any GI issues? yes (GERD) 2. Do you have a personal history of Polyps? no 3. Do you have a family history of Colon Cancer or Polyps? no 4. Diabetes Mellitus? no 5. Joint replacements in the past 12 months?no 6. Major health problems in the past 3 months?no 7. Any artificial heart valves, MVP, or defibrillator?no    MEDICATIONS & ALLERGIES:    Patient reports the following regarding taking any anticoagulation/antiplatelet therapy:   Plavix, Coumadin, Eliquis, Xarelto, Lovenox, Pradaxa, Brilinta, or Effient? no Aspirin? no  Patient confirms/reports the following medications:  Current Outpatient Prescriptions  Medication Sig Dispense Refill  . albuterol (PROAIR HFA) 108 (90 Base) MCG/ACT inhaler INHALE 2 PUFFS 4 TIMES DAILY AS NEEDED 8.5 Inhaler 1  . budesonide (PULMICORT) 180 MCG/ACT inhaler Inhale 1 puff into the lungs 2 (two) times daily. 1 each 3  . diazepam (VALIUM) 2 MG tablet Take by mouth.    . Diclofenac Potassium (CAMBIA) 50 MG PACK Take 1 packet by mouth daily.    . diclofenac sodium (VOLTAREN) 1 % GEL Place onto the skin.    . Estradiol (ELESTRIN) 0.52 MG/0.87 GM (0.06%) GEL Apply 1 application topically daily. 52 g 3  . lansoprazole (PREVACID) 30 MG capsule Take 1 capsule (30 mg total) by mouth daily at 12 noon. 30 capsule 5  . montelukast (SINGULAIR) 10 MG tablet TAKE 1 TABLET (10 MG TOTAL) BY MOUTH AT BEDTIME. 90 tablet 1  . progesterone (PROMETRIUM) 200 MG capsule Take 1 capsule (200 mg total) by mouth daily. 90 capsule 3  . rizatriptan (MAXALT) 10 MG tablet TAKE 1 (ONE) TABLET, ORAL, EVERY TWO HOURS, AS NEEDED    . Testosterone 10 MG/ACT (2%) GEL Place onto the skin.    Marland Kitchen traMADol-acetaminophen (ULTRACET) 37.5-325 MG tablet TAKE 1-2 TABLETS BY MOUTH TWICE  DAILY AS NEEDED  5   No current facility-administered medications for this visit.     Patient confirms/reports the following allergies:  No Known Allergies  No orders of the defined types were placed in this encounter.   AUTHORIZATION INFORMATION Primary Insurance: 1D#: Group #:  Secondary Insurance: 1D#: Group #:  SCHEDULE INFORMATION: Date: 11/1 Time: Location: [place]

## 2017-07-22 ENCOUNTER — Ambulatory Visit
Admission: RE | Admit: 2017-07-22 | Payer: BLUE CROSS/BLUE SHIELD | Source: Ambulatory Visit | Admitting: Gastroenterology

## 2017-07-22 ENCOUNTER — Encounter: Admission: RE | Payer: Self-pay | Source: Ambulatory Visit

## 2017-07-22 SURGERY — COLONOSCOPY WITH PROPOFOL
Anesthesia: General

## 2017-08-06 DIAGNOSIS — F4323 Adjustment disorder with mixed anxiety and depressed mood: Secondary | ICD-10-CM | POA: Diagnosis not present

## 2017-08-16 ENCOUNTER — Encounter: Payer: Self-pay | Admitting: Family Medicine

## 2017-08-16 ENCOUNTER — Ambulatory Visit: Payer: BLUE CROSS/BLUE SHIELD | Admitting: Family Medicine

## 2017-08-16 VITALS — BP 140/82 | HR 80 | Temp 98.4°F | Resp 16 | Ht 65.0 in | Wt 144.0 lb

## 2017-08-16 DIAGNOSIS — J4520 Mild intermittent asthma, uncomplicated: Secondary | ICD-10-CM | POA: Diagnosis not present

## 2017-08-16 DIAGNOSIS — J40 Bronchitis, not specified as acute or chronic: Secondary | ICD-10-CM | POA: Diagnosis not present

## 2017-08-16 MED ORDER — HYDROCODONE-HOMATROPINE 5-1.5 MG/5ML PO SYRP: 5.0000 mL | ORAL_SOLUTION | Freq: Three times a day (TID) | ORAL | 0 refills | Status: DC | PRN

## 2017-08-16 MED ORDER — DOXYCYCLINE HYCLATE 100 MG PO TABS: 100.0000 mg | ORAL_TABLET | Freq: Two times a day (BID) | ORAL | 0 refills | Status: DC

## 2017-08-16 NOTE — Patient Instructions (Signed)

## 2017-08-16 NOTE — Progress Notes (Signed)
Date:  08/16/2017   Name:  Kristin Watkins   DOB:  1955-12-21   MRN:  [id number]  PCP:  Glean Hess, MD    Chief Complaint: Cough (4 days with some fever off and on and body ache )   History of Present Illness:  This is a 61 y.o. female pt of Dr. Gaspar Cola seen urgently for 5d hx cough productive brown phlegm, sore throat, HA, sinus congestion. Mild orthopnea, had low grade fever and vomiting initially. Hx intermittent asthma, used neb this am which helped. Similar episode last year.  Review of Systems:  Review of Systems  HENT: Negative for ear pain and trouble swallowing.   Cardiovascular: Negative for chest pain and leg swelling.  Gastrointestinal: Negative for abdominal pain.  Genitourinary: Negative for difficulty urinating.  Neurological: Negative for syncope and light-headedness.    Patient Active Problem List   Diagnosis Date Noted  . Pre-diabetes 06/15/2017  . Mixed hyperlipidemia 06/15/2017  . Allergic rhinitis 10/15/2016  . Bursitis of hip 07/21/2015  . Degeneration of intervertebral disc of lumbar region 07/21/2015  . Dysphagia 07/21/2015  . Asthma, mild intermittent 07/21/2015  . Acid reflux 07/21/2015  . Personal history of other malignant neoplasm of skin 11/22/2012    Prior to Admission medications   Medication Sig Start Date End Date Taking? Authorizing Provider  albuterol (ACCUNEB) 0.63 MG/3ML nebulizer solution Take 1 ampule by nebulization every 6 (six) hours as needed for wheezing.   Yes [provider]  albuterol (PROAIR HFA) 108 (90 Base) MCG/ACT inhaler INHALE 2 PUFFS 4 TIMES DAILY AS NEEDED 05/22/17  Yes Glean Hess, MD  budesonide (PULMICORT) 180 MCG/ACT inhaler Inhale 1 puff into the lungs 2 (two) times daily. 09/09/16  Yes Glean Hess, MD  budesonide-formoterol [facility]) 160-4.5 MCG/ACT inhaler Inhale 2 puffs into the lungs 2 (two) times daily.   Yes [provider]  Diclofenac Potassium (CAMBIA) 50 MG PACK  Take 1 packet by mouth daily.   Yes [provider]  diclofenac sodium (VOLTAREN) 1 % GEL Place onto the skin. 03/22/14  Yes [provider]  Estradiol (ELESTRIN) 0.52 MG/0.87 GM (0.06%) GEL Apply 1 application topically daily. 06/15/17  Yes Glean Hess, MD  lansoprazole (PREVACID) 30 MG capsule Take 1 capsule (30 mg total) by mouth daily at 12 noon. 03/17/17  Yes Glean Hess, MD  montelukast (SINGULAIR) 10 MG tablet TAKE 1 TABLET (10 MG TOTAL) BY MOUTH AT BEDTIME. 06/09/17  Yes Glean Hess, MD  progesterone (PROMETRIUM) 200 MG capsule Take 1 capsule (200 mg total) by mouth daily. 06/15/17  Yes Glean Hess, MD  Testosterone 10 MG/ACT (2%) GEL Place onto the skin.   Yes [provider]  doxycycline (VIBRA-TABS) 100 MG tablet Take 1 tablet (100 mg total) by mouth 2 (two) times daily. 08/16/17   Javon Snee, Gwyndolyn Saxon, MD  HYDROcodone-homatropine (HYCODAN) 5-1.5 MG/5ML syrup Take 5 mLs by mouth every 8 (eight) hours as needed for cough. 08/16/17   Cy Bresee, Gwyndolyn Saxon, MD  rizatriptan (MAXALT) 10 MG tablet TAKE 1 (ONE) TABLET, ORAL, EVERY TWO HOURS, AS NEEDED 04/01/15   [provider]  traMADol-acetaminophen (ULTRACET) 37.5-325 MG tablet TAKE 1-2 TABLETS BY MOUTH TWICE DAILY AS NEEDED 09/24/16   [provider]    No Known Allergies  Past Surgical History:  Procedure Laterality Date  . BACK SURGERY     spinal stimulator  . ESOPHAGOGASTRODUODENOSCOPY  2015   normal    Social History   Tobacco  Use  . Smoking status: Never Smoker  . Smokeless tobacco: Never Used  Substance Use Topics  . Alcohol use: Yes    Alcohol/week: 0.0 oz  . Drug use: Not on file    Family History  Problem Relation Age of Onset  . Diabetes Father   . Renal cancer Father     Medication list has been reviewed and updated.  Physical Examination: BP 140/82   Pulse 80   Temp 98.4 F (36.9 C)   Resp 16   Ht 5\' 5"  (1.651 m)   Wt 144 lb (65.3 kg)   SpO2 96%    BMI 23.96 kg/m   Physical Exam  Constitutional: She appears well-developed and well-nourished. No distress.  HENT:  Head: Normocephalic and atraumatic.  Right Ear: External ear normal.  Left Ear: External ear normal.  Mouth/Throat: Oropharynx is clear and moist.  TMs clear  Neck: Neck supple.  Cardiovascular: Normal rate, regular rhythm and normal heart sounds.  Pulmonary/Chest: Effort normal and breath sounds normal. She has no wheezes. She has no rales.  Lymphadenopathy:    She has no cervical adenopathy.  Neurological: She is alert.  Skin: Skin is warm and dry. She is not diaphoretic.  Psychiatric: She has a normal mood and affect. Her behavior is normal.  Nursing note and vitals reviewed.   Assessment and Plan:  1. Bronchitis Doxy x 10d with Hycodan prn cough  2. Mild intermittent asthma without complication Cont prn nebs  Return if symptoms worsen or fail to improve.  Satira Anis. Oliveah Zwack, [facility]  08/16/2017

## 2017-08-20 ENCOUNTER — Other Ambulatory Visit: Payer: Self-pay | Admitting: Internal Medicine

## 2017-08-20 DIAGNOSIS — J40 Bronchitis, not specified as acute or chronic: Secondary | ICD-10-CM

## 2017-08-20 DIAGNOSIS — F4323 Adjustment disorder with mixed anxiety and depressed mood: Secondary | ICD-10-CM | POA: Diagnosis not present

## 2017-08-20 MED ORDER — LEVOFLOXACIN 500 MG PO TABS: 500.0000 mg | ORAL_TABLET | Freq: Every day | ORAL | 0 refills | Status: DC

## 2017-08-20 MED ORDER — BENZONATATE 200 MG PO CAPS: 200.0000 mg | ORAL_CAPSULE | Freq: Three times a day (TID) | ORAL | 0 refills | Status: DC

## 2017-08-24 ENCOUNTER — Ambulatory Visit: Payer: BLUE CROSS/BLUE SHIELD | Admitting: Internal Medicine

## 2017-08-27 DIAGNOSIS — F4323 Adjustment disorder with mixed anxiety and depressed mood: Secondary | ICD-10-CM | POA: Diagnosis not present

## 2017-09-03 ENCOUNTER — Other Ambulatory Visit: Payer: Self-pay

## 2017-09-03 DIAGNOSIS — J309 Allergic rhinitis, unspecified: Secondary | ICD-10-CM

## 2017-09-03 DIAGNOSIS — F4323 Adjustment disorder with mixed anxiety and depressed mood: Secondary | ICD-10-CM | POA: Diagnosis not present

## 2017-09-03 MED ORDER — MONTELUKAST SODIUM 10 MG PO TABS: 10.0000 mg | ORAL_TABLET | Freq: Every day | ORAL | 1 refills | Status: DC

## 2017-09-07 DIAGNOSIS — F4322 Adjustment disorder with anxiety: Secondary | ICD-10-CM | POA: Diagnosis not present

## 2017-09-08 DIAGNOSIS — G894 Chronic pain syndrome: Secondary | ICD-10-CM | POA: Diagnosis not present

## 2017-09-09 ENCOUNTER — Ambulatory Visit: Payer: BLUE CROSS/BLUE SHIELD | Admitting: Internal Medicine

## 2017-09-09 ENCOUNTER — Encounter: Payer: Self-pay | Admitting: Internal Medicine

## 2017-09-09 VITALS — BP 124/78 | HR 93 | Temp 97.9°F | Ht 65.0 in | Wt 144.0 lb

## 2017-09-09 DIAGNOSIS — J40 Bronchitis, not specified as acute or chronic: Secondary | ICD-10-CM

## 2017-09-09 MED ORDER — HYDROCODONE-HOMATROPINE 5-1.5 MG/5ML PO SYRP: 5.0000 mL | ORAL_SOLUTION | Freq: Three times a day (TID) | ORAL | 0 refills | Status: DC | PRN

## 2017-09-09 NOTE — Progress Notes (Signed)
Date:  09/09/2017   Name:  Kristin Watkins   DOB:  1956-06-25   MRN:  [id number]   Chief Complaint: Cough (Never completely went away from bronchitis. Finished doxycycline antibiotics. Didn't feel well enough to go get levaquin. X1.5 week- post nasal drip, right maxillary sinus pain. Last night coughing to the point of throwing up. With tooth ache- started levaquin. one more day left of that. Trying to slep sitting up. One more dose of cough medicine- wants to know if anything else. )    Review of Systems  Constitutional: Positive for fatigue. Negative for chills and fever.  HENT: Positive for congestion, sinus pressure and sinus pain. Negative for ear pain and trouble swallowing.   Respiratory: Positive for cough. Negative for chest tightness, shortness of breath and wheezing.   Cardiovascular: Negative for chest pain and palpitations.    Patient Active Problem List   Diagnosis Date Noted  . Pre-diabetes 06/15/2017  . Mixed hyperlipidemia 06/15/2017  . Allergic rhinitis 10/15/2016  . Bursitis of hip 07/21/2015  . Degeneration of intervertebral disc of lumbar region 07/21/2015  . Dysphagia 07/21/2015  . Asthma, mild intermittent 07/21/2015  . Acid reflux 07/21/2015  . Personal history of other malignant neoplasm of skin 11/22/2012    Prior to Admission medications   Medication Sig Start Date End Date Taking? Authorizing Provider  albuterol (ACCUNEB) 0.63 MG/3ML nebulizer solution Take 1 ampule by nebulization every 6 (six) hours as needed for wheezing.   Yes [provider]  albuterol (PROAIR HFA) 108 (90 Base) MCG/ACT inhaler INHALE 2 PUFFS 4 TIMES DAILY AS NEEDED 05/22/17  Yes Glean Hess, MD  benzonatate (TESSALON) 200 MG capsule Take 1 capsule (200 mg total) by mouth 3 (three) times daily. 08/20/17  Yes Glean Hess, MD  budesonide (PULMICORT) 180 MCG/ACT inhaler Inhale 1 puff into the lungs 2 (two) times daily. 09/09/16  Yes Glean Hess, MD    budesonide-formoterol [facility]) 160-4.5 MCG/ACT inhaler Inhale 2 puffs into the lungs 2 (two) times daily.   Yes [provider]  Diclofenac Potassium (CAMBIA) 50 MG PACK Take 1 packet by mouth daily.   Yes [provider]  diclofenac sodium (VOLTAREN) 1 % GEL Place onto the skin. 03/22/14  Yes [provider]  Estradiol (ELESTRIN) 0.52 MG/0.87 GM (0.06%) GEL Apply 1 application topically daily. 06/15/17  Yes Glean Hess, MD  HYDROcodone-homatropine [facility]) 5-1.5 MG/5ML syrup Take 5 mLs by mouth every 8 (eight) hours as needed for cough. 08/16/17  Yes Plonk, Gwyndolyn Saxon, MD  lansoprazole (PREVACID) 30 MG capsule Take 1 capsule (30 mg total) by mouth daily at 12 noon. 03/17/17  Yes Glean Hess, MD  levofloxacin (LEVAQUIN) 500 MG tablet Take 1 tablet (500 mg total) by mouth daily. 08/20/17  Yes Glean Hess, MD  montelukast (SINGULAIR) 10 MG tablet Take 1 tablet (10 mg total) by mouth at bedtime. 09/03/17  Yes Glean Hess, MD  progesterone (PROMETRIUM) 200 MG capsule Take 1 capsule (200 mg total) by mouth daily. 06/15/17  Yes Glean Hess, MD  rizatriptan (MAXALT) 10 MG tablet TAKE 1 (ONE) TABLET, ORAL, EVERY TWO HOURS, AS NEEDED 04/01/15  Yes [provider]  Testosterone 10 MG/ACT (2%) GEL Place onto the skin.   Yes [provider]  traMADol-acetaminophen (ULTRACET) 37.5-325 MG tablet TAKE 1-2 TABLETS BY MOUTH TWICE DAILY AS NEEDED 09/24/16  Yes [provider]    No Known Allergies  Past Surgical History:  Procedure  Laterality Date  . BACK SURGERY     spinal stimulator  . ESOPHAGOGASTRODUODENOSCOPY  2015   normal    Social History   Tobacco Use  . Smoking status: Never Smoker  . Smokeless tobacco: Never Used  Substance Use Topics  . Alcohol use: Yes    Alcohol/week: 0.0 oz  . Drug use: No     Medication list has been reviewed and updated.  PHQ 2/9 Scores 06/15/2017  PHQ - 2 Score 0    Physical Exam   Constitutional: She is oriented to person, place, and time. She appears well-developed and well-nourished.  HENT:  Right Ear: External ear and ear canal normal. Tympanic membrane is not erythematous and not retracted.  Left Ear: External ear and ear canal normal. Tympanic membrane is not erythematous and not retracted.  Nose: Right sinus exhibits maxillary sinus tenderness and frontal sinus tenderness. Left sinus exhibits no maxillary sinus tenderness and no frontal sinus tenderness.  Mouth/Throat: Uvula is midline and mucous membranes are normal. No oral lesions. No oropharyngeal exudate or posterior oropharyngeal erythema.  Cardiovascular: Normal rate, regular rhythm, normal heart sounds and intact distal pulses.  Pulmonary/Chest: Breath sounds normal. She has no wheezes. She has no rales.  Lymphadenopathy:    She has no cervical adenopathy.  Neurological: She is alert and oriented to person, place, and time.    BP 124/78   Pulse 93   Temp 97.9 F (36.6 C) (Oral)   Ht 5\' 5"  (1.651 m)   Wt 144 lb (65.3 kg)   SpO2 98%   BMI 23.96 kg/m   Assessment and Plan: 1. Bronchitis Completed course of antibiotics Continue fluids, rest, cough syrup - HYDROcodone-homatropine (HYCODAN) 5-1.5 MG/5ML syrup; Take 5 mLs by mouth every 8 (eight) hours as needed for cough.  Dispense: 180 mL; Refill: 0   Meds ordered this encounter  Medications  . HYDROcodone-homatropine (HYCODAN) 5-1.5 MG/5ML syrup    Sig: Take 5 mLs by mouth every 8 (eight) hours as needed for cough.    Dispense:  180 mL    Refill:  0    Partially dictated using Editor, commissioning. Any errors are unintentional.  Halina Maidens, MD [facility]  09/09/2017

## 2017-09-10 DIAGNOSIS — F4323 Adjustment disorder with mixed anxiety and depressed mood: Secondary | ICD-10-CM | POA: Diagnosis not present

## 2017-09-24 DIAGNOSIS — Z79899 Other long term (current) drug therapy: Secondary | ICD-10-CM | POA: Diagnosis not present

## 2017-09-24 DIAGNOSIS — K219 Gastro-esophageal reflux disease without esophagitis: Secondary | ICD-10-CM | POA: Diagnosis not present

## 2017-09-24 DIAGNOSIS — J45909 Unspecified asthma, uncomplicated: Secondary | ICD-10-CM | POA: Diagnosis not present

## 2017-09-24 DIAGNOSIS — T85193A Other mechanical complication of implanted electronic neurostimulator, generator, initial encounter: Secondary | ICD-10-CM | POA: Diagnosis not present

## 2017-09-24 DIAGNOSIS — Z981 Arthrodesis status: Secondary | ICD-10-CM | POA: Diagnosis not present

## 2017-09-24 DIAGNOSIS — G894 Chronic pain syndrome: Secondary | ICD-10-CM | POA: Diagnosis not present

## 2017-09-25 DIAGNOSIS — F4322 Adjustment disorder with anxiety: Secondary | ICD-10-CM | POA: Diagnosis not present

## 2017-09-27 DIAGNOSIS — Z86018 Personal history of other benign neoplasm: Secondary | ICD-10-CM | POA: Diagnosis not present

## 2017-09-27 DIAGNOSIS — L578 Other skin changes due to chronic exposure to nonionizing radiation: Secondary | ICD-10-CM | POA: Diagnosis not present

## 2017-09-27 DIAGNOSIS — Z85828 Personal history of other malignant neoplasm of skin: Secondary | ICD-10-CM | POA: Diagnosis not present

## 2017-09-27 DIAGNOSIS — Z872 Personal history of diseases of the skin and subcutaneous tissue: Secondary | ICD-10-CM | POA: Diagnosis not present

## 2017-09-29 DIAGNOSIS — F4322 Adjustment disorder with anxiety: Secondary | ICD-10-CM | POA: Diagnosis not present

## 2017-10-08 DIAGNOSIS — F4323 Adjustment disorder with mixed anxiety and depressed mood: Secondary | ICD-10-CM | POA: Diagnosis not present

## 2017-10-12 DIAGNOSIS — F4322 Adjustment disorder with anxiety: Secondary | ICD-10-CM | POA: Diagnosis not present

## 2017-10-20 DIAGNOSIS — F4322 Adjustment disorder with anxiety: Secondary | ICD-10-CM | POA: Diagnosis not present

## 2017-10-22 DIAGNOSIS — F4323 Adjustment disorder with mixed anxiety and depressed mood: Secondary | ICD-10-CM | POA: Diagnosis not present

## 2017-10-27 DIAGNOSIS — F4322 Adjustment disorder with anxiety: Secondary | ICD-10-CM | POA: Diagnosis not present

## 2017-11-17 DIAGNOSIS — F4323 Adjustment disorder with mixed anxiety and depressed mood: Secondary | ICD-10-CM | POA: Diagnosis not present

## 2017-12-03 DIAGNOSIS — F4323 Adjustment disorder with mixed anxiety and depressed mood: Secondary | ICD-10-CM | POA: Diagnosis not present

## 2017-12-10 ENCOUNTER — Encounter: Payer: Self-pay | Admitting: Internal Medicine

## 2017-12-10 ENCOUNTER — Ambulatory Visit (INDEPENDENT_AMBULATORY_CARE_PROVIDER_SITE_OTHER): Payer: BLUE CROSS/BLUE SHIELD | Admitting: Internal Medicine

## 2017-12-10 ENCOUNTER — Other Ambulatory Visit: Payer: Self-pay | Admitting: Internal Medicine

## 2017-12-10 VITALS — BP 130/78 | HR 92 | Temp 98.0°F | Ht 65.0 in | Wt 145.0 lb

## 2017-12-10 DIAGNOSIS — J0100 Acute maxillary sinusitis, unspecified: Secondary | ICD-10-CM

## 2017-12-10 DIAGNOSIS — J40 Bronchitis, not specified as acute or chronic: Secondary | ICD-10-CM

## 2017-12-10 MED ORDER — LEVOFLOXACIN 500 MG PO TABS: 500.0000 mg | ORAL_TABLET | Freq: Every day | ORAL | 0 refills | Status: AC

## 2017-12-10 NOTE — Progress Notes (Signed)
Date:  12/10/2017   Name:  Kristin Watkins   DOB:  29-Jan-1956   MRN:  [id number]   Chief Complaint: Sinusitis (X 1 week. Sinus pain and headache. Coughing- gray mucous. Hot and cold. Coughing to the point of vomitting since yesterday. Tried mucinex. )  Sinusitis  This is a recurrent problem. The current episode started in the past 7 days. The problem has been gradually worsening since onset. There has been no fever. Associated symptoms include chills, coughing, headaches, sinus pressure and a sore throat. Pertinent negatives include no shortness of breath. Past treatments include oral decongestants. The treatment provided mild relief.     Review of Systems  Constitutional: Positive for chills. Negative for fatigue and fever.  HENT: Positive for postnasal drip, sinus pressure and sore throat. Negative for trouble swallowing.   Eyes: Negative for visual disturbance.  Respiratory: Positive for cough. Negative for chest tightness, shortness of breath and wheezing.   Cardiovascular: Negative for chest pain and palpitations.  Allergic/Immunologic: Negative for environmental allergies.  Neurological: Positive for headaches. Negative for dizziness, syncope and light-headedness.    Patient Active Problem List   Diagnosis Date Noted  . Pre-diabetes 06/15/2017  . Mixed hyperlipidemia 06/15/2017  . Allergic rhinitis 10/15/2016  . Bursitis of hip 07/21/2015  . Degeneration of intervertebral disc of lumbar region 07/21/2015  . Dysphagia 07/21/2015  . Asthma, mild intermittent 07/21/2015  . Acid reflux 07/21/2015  . Personal history of other malignant neoplasm of skin 11/22/2012    Prior to Admission medications   Medication Sig Start Date End Date Taking? Authorizing Provider  albuterol (ACCUNEB) 0.63 MG/3ML nebulizer solution Take 1 ampule by nebulization every 6 (six) hours as needed for wheezing.   Yes [provider]  albuterol (PROAIR HFA) 108 (90 Base) MCG/ACT inhaler  INHALE 2 PUFFS 4 TIMES DAILY AS NEEDED 05/22/17  Yes Glean Hess, MD  benzonatate (TESSALON) 200 MG capsule Take 1 capsule (200 mg total) by mouth 3 (three) times daily. 08/20/17  Yes Glean Hess, MD  budesonide (PULMICORT) 180 MCG/ACT inhaler Inhale 1 puff into the lungs 2 (two) times daily. 09/09/16  Yes Glean Hess, MD  budesonide-formoterol [facility]) 160-4.5 MCG/ACT inhaler Inhale 2 puffs into the lungs 2 (two) times daily.   Yes [provider]  Diclofenac Potassium (CAMBIA) 50 MG PACK Take 1 packet by mouth daily.   Yes [provider]  diclofenac sodium (VOLTAREN) 1 % GEL Place onto the skin. 03/22/14  Yes [provider]  Estradiol (ELESTRIN) 0.52 MG/0.87 GM (0.06%) GEL Apply 1 application topically daily. 06/15/17  Yes Glean Hess, MD  HYDROcodone-homatropine [facility]) 5-1.5 MG/5ML syrup Take 5 mLs by mouth every 8 (eight) hours as needed for cough. 09/09/17  Yes Glean Hess, MD  lansoprazole (PREVACID) 30 MG capsule Take 1 capsule (30 mg total) by mouth daily at 12 noon. 03/17/17  Yes Glean Hess, MD  montelukast (SINGULAIR) 10 MG tablet Take 1 tablet (10 mg total) by mouth at bedtime. 09/03/17  Yes Glean Hess, MD  progesterone (PROMETRIUM) 200 MG capsule Take 1 capsule (200 mg total) by mouth daily. 06/15/17  Yes Glean Hess, MD  traMADol-acetaminophen (ULTRACET) 37.5-325 MG tablet TAKE 1-2 TABLETS BY MOUTH TWICE DAILY AS NEEDED 09/24/16  Yes [provider]  Testosterone 10 MG/ACT (2%) GEL Place onto the skin.    [provider]    No Known Allergies  Past Surgical History:  Procedure Laterality Date  .  BACK SURGERY     spinal stimulator  . ESOPHAGOGASTRODUODENOSCOPY  2015   normal    Social History   Tobacco Use  . Smoking status: Never Smoker  . Smokeless tobacco: Never Used  Substance Use Topics  . Alcohol use: Yes    Alcohol/week: 0.0 oz  . Drug use: No     Medication list has  been reviewed and updated.  PHQ 2/9 Scores 06/15/2017  PHQ - 2 Score 0    Physical Exam  Constitutional: She is oriented to person, place, and time. She appears well-developed and well-nourished.  HENT:  Right Ear: External ear and ear canal normal. Tympanic membrane is not erythematous and not retracted.  Left Ear: External ear and ear canal normal. Tympanic membrane is not erythematous and not retracted.  Nose: Right sinus exhibits maxillary sinus tenderness and frontal sinus tenderness. Left sinus exhibits maxillary sinus tenderness and frontal sinus tenderness.  Mouth/Throat: Uvula is midline and mucous membranes are normal. No oral lesions. Posterior oropharyngeal erythema present. No oropharyngeal exudate.  Cardiovascular: Normal rate, regular rhythm and normal heart sounds.  Pulmonary/Chest: Breath sounds normal. She has no wheezes. She has no rales.  Lymphadenopathy:    She has no cervical adenopathy.  Neurological: She is alert and oriented to person, place, and time.    BP 130/78   Pulse 92   Temp 98 F (36.7 C) (Oral)   Ht 5\' 5"  (1.651 m)   Wt 145 lb (65.8 kg)   SpO2 100%   BMI 24.13 kg/m   Assessment and Plan: 1. Acute non-recurrent maxillary sinusitis Continue nasal spray decongestants as needed - levofloxacin (LEVAQUIN) 500 MG tablet; Take 1 tablet (500 mg total) by mouth daily for 7 days.  Dispense: 7 tablet; Refill: 0   Meds ordered this encounter  Medications  . levofloxacin (LEVAQUIN) 500 MG tablet    Sig: Take 1 tablet (500 mg total) by mouth daily for 7 days.    Dispense:  7 tablet    Refill:  0    Partially dictated using Editor, commissioning. Any errors are unintentional.  Halina Maidens, MD [facility]  12/10/2017

## 2017-12-11 DIAGNOSIS — F4322 Adjustment disorder with anxiety: Secondary | ICD-10-CM | POA: Diagnosis not present

## 2017-12-15 DIAGNOSIS — F4323 Adjustment disorder with mixed anxiety and depressed mood: Secondary | ICD-10-CM | POA: Diagnosis not present

## 2017-12-18 ENCOUNTER — Other Ambulatory Visit: Payer: Self-pay | Admitting: Internal Medicine

## 2017-12-18 DIAGNOSIS — N951 Menopausal and female climacteric states: Secondary | ICD-10-CM

## 2017-12-20 ENCOUNTER — Other Ambulatory Visit: Payer: Self-pay

## 2017-12-20 MED ORDER — AMOXICILLIN-POT CLAVULANATE 875-125 MG PO TABS: 1.0000 | ORAL_TABLET | Freq: Two times a day (BID) | ORAL | 0 refills | Status: AC

## 2017-12-20 NOTE — Progress Notes (Signed)
Patient called stating she finished levaquin antibiotic and still very sick and bad sinus infection with right sided face/sinus pain. Wanted to know if she could try amoxicillin this time since it helped with her last sinus issue. Spoke with Dr Army Melia and she verbalized approval for this. Sent this in and called patient to inform.

## 2017-12-23 ENCOUNTER — Ambulatory Visit: Payer: BLUE CROSS/BLUE SHIELD | Admitting: Internal Medicine

## 2017-12-29 DIAGNOSIS — M5136 Other intervertebral disc degeneration, lumbar region: Secondary | ICD-10-CM | POA: Diagnosis not present

## 2017-12-29 DIAGNOSIS — G894 Chronic pain syndrome: Secondary | ICD-10-CM | POA: Diagnosis not present

## 2018-01-12 DIAGNOSIS — F4323 Adjustment disorder with mixed anxiety and depressed mood: Secondary | ICD-10-CM | POA: Diagnosis not present

## 2018-02-02 DIAGNOSIS — F4323 Adjustment disorder with mixed anxiety and depressed mood: Secondary | ICD-10-CM | POA: Diagnosis not present

## 2018-02-09 DIAGNOSIS — F4323 Adjustment disorder with mixed anxiety and depressed mood: Secondary | ICD-10-CM | POA: Diagnosis not present

## 2018-02-16 ENCOUNTER — Other Ambulatory Visit: Payer: Self-pay | Admitting: Internal Medicine

## 2018-02-23 ENCOUNTER — Other Ambulatory Visit: Payer: Self-pay | Admitting: Internal Medicine

## 2018-02-23 DIAGNOSIS — F4323 Adjustment disorder with mixed anxiety and depressed mood: Secondary | ICD-10-CM | POA: Diagnosis not present

## 2018-03-09 DIAGNOSIS — F4323 Adjustment disorder with mixed anxiety and depressed mood: Secondary | ICD-10-CM | POA: Diagnosis not present

## 2018-03-11 ENCOUNTER — Telehealth: Payer: Self-pay

## 2018-03-11 NOTE — Telephone Encounter (Signed)
[facility] called asking why we sent wrong RX of testosterone and I advised that the 10 mg 2% was D/C and he explained that is because the nurse back in May was supposed to have PCP cx and write for corrected dose og 10 mg/ 1 ml and keep instructions the same. This is how patient has used this for years.  Please send corrected dose patient waiting.

## 2018-03-15 NOTE — Telephone Encounter (Signed)
We already corrected this - I re-wrote the Rx and when it wouldn't go through on the fax, Garlon Hatchet spoke to the pharmacist.  Not sure what their issue is.

## 2018-03-15 NOTE — Telephone Encounter (Signed)
Left message with patient that we already corrected this and pharmacy keeps reaching out. I advised her to call me [facility] in [place] to discuss or she can Leave message with front desk that she has gotten RX already picked up.

## 2018-03-28 ENCOUNTER — Telehealth: Payer: Self-pay

## 2018-03-28 ENCOUNTER — Encounter: Payer: Self-pay | Admitting: Internal Medicine

## 2018-03-28 DIAGNOSIS — Z79890 Hormone replacement therapy (postmenopausal): Secondary | ICD-10-CM | POA: Insufficient documentation

## 2018-03-28 NOTE — Telephone Encounter (Signed)
Called verbal orer to [facility] filled 50ml of 1mg /ml 0.1 % apply 7.5 ml once daily 2 refills patient aware

## 2018-04-08 ENCOUNTER — Other Ambulatory Visit: Payer: Self-pay | Admitting: Internal Medicine

## 2018-04-08 DIAGNOSIS — F4323 Adjustment disorder with mixed anxiety and depressed mood: Secondary | ICD-10-CM | POA: Diagnosis not present

## 2018-04-08 DIAGNOSIS — K219 Gastro-esophageal reflux disease without esophagitis: Secondary | ICD-10-CM

## 2018-04-20 DIAGNOSIS — F4323 Adjustment disorder with mixed anxiety and depressed mood: Secondary | ICD-10-CM | POA: Diagnosis not present

## 2018-05-13 DIAGNOSIS — F4323 Adjustment disorder with mixed anxiety and depressed mood: Secondary | ICD-10-CM | POA: Diagnosis not present

## 2018-05-18 DIAGNOSIS — F4323 Adjustment disorder with mixed anxiety and depressed mood: Secondary | ICD-10-CM | POA: Diagnosis not present

## 2018-06-01 DIAGNOSIS — F4323 Adjustment disorder with mixed anxiety and depressed mood: Secondary | ICD-10-CM | POA: Diagnosis not present

## 2018-06-03 ENCOUNTER — Other Ambulatory Visit: Payer: Self-pay | Admitting: Internal Medicine

## 2018-06-03 DIAGNOSIS — J309 Allergic rhinitis, unspecified: Secondary | ICD-10-CM

## 2018-06-15 DIAGNOSIS — F4323 Adjustment disorder with mixed anxiety and depressed mood: Secondary | ICD-10-CM | POA: Diagnosis not present

## 2018-06-26 ENCOUNTER — Other Ambulatory Visit: Payer: Self-pay | Admitting: Internal Medicine

## 2018-06-30 ENCOUNTER — Other Ambulatory Visit: Payer: Self-pay | Admitting: Internal Medicine

## 2018-08-04 ENCOUNTER — Other Ambulatory Visit: Payer: Self-pay | Admitting: Internal Medicine

## 2018-09-02 ENCOUNTER — Other Ambulatory Visit: Payer: Self-pay | Admitting: Internal Medicine

## 2018-09-02 DIAGNOSIS — N951 Menopausal and female climacteric states: Secondary | ICD-10-CM

## 2018-09-02 MED ORDER — TESTOSTERONE 1.62 % TD GEL: 1.0000 mg | Freq: Every day | TRANSDERMAL | Status: DC

## 2018-09-06 ENCOUNTER — Ambulatory Visit: Payer: BLUE CROSS/BLUE SHIELD | Admitting: Internal Medicine

## 2018-09-06 ENCOUNTER — Encounter: Payer: Self-pay | Admitting: Internal Medicine

## 2018-09-06 ENCOUNTER — Telehealth: Payer: Self-pay

## 2018-09-06 VITALS — BP 130/76 | HR 100 | Ht 65.0 in | Wt 148.0 lb

## 2018-09-06 DIAGNOSIS — J4520 Mild intermittent asthma, uncomplicated: Secondary | ICD-10-CM

## 2018-09-06 DIAGNOSIS — Z1231 Encounter for screening mammogram for malignant neoplasm of breast: Secondary | ICD-10-CM

## 2018-09-06 DIAGNOSIS — J309 Allergic rhinitis, unspecified: Secondary | ICD-10-CM | POA: Diagnosis not present

## 2018-09-06 DIAGNOSIS — G4484 Primary exertional headache: Secondary | ICD-10-CM | POA: Diagnosis not present

## 2018-09-06 DIAGNOSIS — Z23 Encounter for immunization: Secondary | ICD-10-CM

## 2018-09-06 MED ORDER — ALBUTEROL SULFATE HFA 108 (90 BASE) MCG/ACT IN AERS: 2.0000 | INHALATION_SPRAY | Freq: Four times a day (QID) | RESPIRATORY_TRACT | 12 refills | Status: DC | PRN

## 2018-09-06 MED ORDER — MONTELUKAST SODIUM 10 MG PO TABS: 10.0000 mg | ORAL_TABLET | Freq: Every day | ORAL | 3 refills | Status: DC

## 2018-09-06 MED ORDER — BUDESONIDE 180 MCG/ACT IN AEPB: 1.0000 | INHALATION_SPRAY | Freq: Two times a day (BID) | RESPIRATORY_TRACT | 3 refills | Status: DC

## 2018-09-06 MED ORDER — DICLOFENAC POTASSIUM(MIGRAINE) 50 MG PO PACK: 50.0000 mg | PACK | Freq: Every day | ORAL | 2 refills | Status: DC

## 2018-09-06 NOTE — Telephone Encounter (Signed)
Prior Auth finished on patients medication for Diclofenac Potassium (CAMBIA) 50 MG PACK 50 mg, Daily    Sent to [employer] through covermymeds.   Received this note from the PA:  " Your information has been submitted to [place]. [employer] [place] will review the request and fax you a determination directly, typically within 3 business days of your submission once all necessary information is received. If [employer] [place] has not responded in 3 business days or if you have any questions about your submission, contact [place] at [phone]."

## 2018-09-06 NOTE — Progress Notes (Signed)
Date:  09/06/2018   Name:  Kristin Watkins   DOB:  May 04, 1956   MRN:  [id number]   Chief Complaint: Asthma (Needs refill on pulmicort and proair inhaler. Also, Singulair. Was first prescribed by pulmonologist and wanted to see PCP for this and other meds. ) and Hormone Replacement (Needs refill on testosterone. )  Asthma  There is no cough, shortness of breath or wheezing. This is a recurrent problem. The current episode started more than 1 year ago. The problem occurs intermittently. Associated symptoms include headaches. Pertinent negatives include no chest pain or fever. Her symptoms are not alleviated by beta-agonist, steroid inhaler and leukotriene antagonist. Her past medical history is significant for asthma.  Back Pain  This is a chronic problem. Associated symptoms include headaches. Pertinent negatives include no chest pain or fever.   Activity induced headache - after an old head injury.   Taking Cambia 50 mg pack as needed.  HM - due for mammogram, denies breast issues.  Pap no longer needed due to age/sexual activity hx and difficult exam.  Will need colonoscopy soon.  Review of Systems  Constitutional: Negative for chills, diaphoresis, fatigue and fever.  Respiratory: Negative for cough, chest tightness, shortness of breath and wheezing.   Cardiovascular: Negative for chest pain, palpitations and leg swelling.  Genitourinary: Negative for menstrual problem.  Musculoskeletal: Positive for back pain.  Neurological: Positive for headaches. Negative for dizziness.  Hematological: Negative for adenopathy.    Patient Active Problem List   Diagnosis Date Noted  . Hormone replacement therapy (HRT) 03/28/2018  . Pre-diabetes 06/15/2017  . Mixed hyperlipidemia 06/15/2017  . Allergic rhinitis 10/15/2016  . Bursitis of hip 07/21/2015  . Degeneration of intervertebral disc of lumbar region 07/21/2015  . Dysphagia 07/21/2015  . Asthma, mild intermittent 07/21/2015  . Acid  reflux 07/21/2015  . Personal history of other malignant neoplasm of skin 11/22/2012    No Known Allergies  Past Surgical History:  Procedure Laterality Date  . BACK SURGERY     spinal stimulator  . ESOPHAGOGASTRODUODENOSCOPY  2015   normal    Social History   Tobacco Use  . Smoking status: Never Smoker  . Smokeless tobacco: Never Used  Substance Use Topics  . Alcohol use: Yes    Alcohol/week: 0.0 standard drinks  . Drug use: No     Medication list has been reviewed and updated.  Current Meds  Medication Sig  . albuterol (ACCUNEB) 0.63 MG/3ML nebulizer solution Take 1 ampule by nebulization every 6 (six) hours as needed for wheezing.  . budesonide (PULMICORT) 180 MCG/ACT inhaler Inhale 1 puff into the lungs 2 (two) times daily.  . diazepam (VALIUM) 5 MG tablet Take 5 mg by mouth every 6 (six) hours as needed for anxiety.  . Diclofenac Potassium (CAMBIA) 50 MG PACK Take 1 packet by mouth daily.  . diclofenac sodium (VOLTAREN) 1 % GEL Place onto the skin.  Marland Kitchen ELESTRIN 0.52 MG/0.87 GM (0.06%) GEL APPLY TO AFFECTED AREA EVERY DAY  . montelukast (SINGULAIR) 10 MG tablet TAKE 1 TABLET BY MOUTH EVERYDAY AT BEDTIME  . PROAIR HFA 108 (90 Base) MCG/ACT inhaler INHALE 2 PUFFS 4 TIMES DAILY AS NEEDED  . progesterone (PROMETRIUM) 200 MG capsule TAKE ONE CAPSULE BY MOUTH DAILY  . Testosterone 1.62 % GEL Apply 1.69CV (3 clicks) transdermally daily.  . traMADol-acetaminophen (ULTRACET) 37.5-325 MG tablet TAKE 1-2 TABLETS BY MOUTH TWICE DAILY AS NEEDED    PHQ 2/9 Scores 09/06/2018 06/15/2017  PHQ -  2 Score 0 0    Physical Exam Vitals signs and nursing note reviewed.  Constitutional:      General: She is not in acute distress.    Appearance: She is well-developed.  HENT:     Head: Normocephalic and atraumatic.  Neck:     Musculoskeletal: Normal range of motion and neck supple.  Cardiovascular:     Rate and Rhythm: Normal rate and regular rhythm.     Pulses: Normal pulses.    Pulmonary:     Effort: Pulmonary effort is normal. No prolonged expiration or respiratory distress.     Breath sounds: Normal breath sounds. No rales.  Chest:     Chest wall: No tenderness.  Musculoskeletal: Normal range of motion.  Skin:    General: Skin is warm and dry.     Findings: No rash.  Neurological:     Mental Status: She is alert and oriented to person, place, and time.  Psychiatric:        Attention and Perception: Attention and perception normal.        Mood and Affect: Mood normal.        Behavior: Behavior normal.        Thought Content: Thought content normal.     BP 130/76 (BP Location: Right Arm, Patient Position: Sitting, Cuff Size: Normal)   Pulse 100   Ht 5\' 5"  (1.651 m)   Wt 148 lb (67.1 kg)   SpO2 96%   BMI 24.63 kg/m   Assessment and Plan: 1. Mild intermittent asthma without complication Controlled, continue current therapy - budesonide (PULMICORT) 180 MCG/ACT inhaler; Inhale 1 puff into the lungs 2 (two) times daily.  Dispense: 1 each; Refill: 3 - albuterol (PROAIR HFA) 108 (90 Base) MCG/ACT inhaler; Inhale 2 puffs into the lungs every 6 (six) hours as needed for wheezing or shortness of breath.  Dispense: 8.5 Inhaler; Refill: 12  2. Allergic rhinitis - montelukast (SINGULAIR) 10 MG tablet; Take 1 tablet (10 mg total) by mouth daily.  Dispense: 90 tablet; Refill: 3  3. Primary exertional headache Continue cambia PRN - Diclofenac Potassium (CAMBIA) 50 MG PACK; Take 50 mg by mouth daily.  Dispense: 30 each; Refill: 2  4. Encounter for screening mammogram for breast cancer - MM 3D SCREEN BREAST BILATERAL; Future   Partially dictated using Editor, commissioning. Any errors are unintentional.  Halina Maidens, MD [facility]  09/06/2018

## 2018-09-08 NOTE — Telephone Encounter (Signed)
Received Fax from [facility] with outcome on PA stating it was DENIED.  States:  " Cathren Harsh may be approved for more than 9 doses per month to treat moderate to severe migraine headaches when the member has more than 4 migraine headaches per month and has tried medications to prevent migraines (such as topiramate, propranolol, or divalproex), for at least 2 to 3 months. Cathren Harsh is approved for the short term treatment of migraine attacks in adults. Cathren Harsh is not approved when used to prevent migraines. "  Will give information to Dr Army Melia and call and inform patient of this.  CNM

## 2018-09-28 DIAGNOSIS — Z85828 Personal history of other malignant neoplasm of skin: Secondary | ICD-10-CM | POA: Diagnosis not present

## 2018-09-28 DIAGNOSIS — L578 Other skin changes due to chronic exposure to nonionizing radiation: Secondary | ICD-10-CM | POA: Diagnosis not present

## 2018-09-28 DIAGNOSIS — Z1283 Encounter for screening for malignant neoplasm of skin: Secondary | ICD-10-CM | POA: Diagnosis not present

## 2018-09-28 DIAGNOSIS — L570 Actinic keratosis: Secondary | ICD-10-CM | POA: Diagnosis not present

## 2018-09-28 DIAGNOSIS — Z872 Personal history of diseases of the skin and subcutaneous tissue: Secondary | ICD-10-CM | POA: Diagnosis not present

## 2018-10-02 ENCOUNTER — Other Ambulatory Visit: Payer: Self-pay | Admitting: Internal Medicine

## 2018-10-02 DIAGNOSIS — N951 Menopausal and female climacteric states: Secondary | ICD-10-CM

## 2018-10-04 ENCOUNTER — Ambulatory Visit
Admission: RE | Admit: 2018-10-04 | Discharge: 2018-10-04 | Disposition: A | Discharge status: Home / Self Care | Payer: BLUE CROSS/BLUE SHIELD | Source: Ambulatory Visit | Attending: Internal Medicine | Admitting: Internal Medicine

## 2018-10-04 ENCOUNTER — Encounter (INDEPENDENT_AMBULATORY_CARE_PROVIDER_SITE_OTHER): Payer: Self-pay

## 2018-10-04 DIAGNOSIS — Z1231 Encounter for screening mammogram for malignant neoplasm of breast: Secondary | ICD-10-CM | POA: Insufficient documentation

## 2018-10-07 ENCOUNTER — Ambulatory Visit: Payer: BLUE CROSS/BLUE SHIELD | Admitting: Internal Medicine

## 2018-10-07 ENCOUNTER — Encounter: Payer: Self-pay | Admitting: Internal Medicine

## 2018-10-07 VITALS — BP 138/88 | HR 93 | Temp 98.0°F | Ht 65.0 in | Wt 152.0 lb

## 2018-10-07 DIAGNOSIS — J019 Acute sinusitis, unspecified: Secondary | ICD-10-CM

## 2018-10-07 MED ORDER — GUAIFENESIN-CODEINE 100-10 MG/5ML PO SYRP: 5.0000 mL | ORAL_SOLUTION | Freq: Three times a day (TID) | ORAL | 0 refills | Status: DC | PRN

## 2018-10-07 MED ORDER — AMOXICILLIN-POT CLAVULANATE 875-125 MG PO TABS: 1.0000 | ORAL_TABLET | Freq: Two times a day (BID) | ORAL | 0 refills | Status: DC

## 2018-10-07 MED ORDER — BENZONATATE 100 MG PO CAPS: 100.0000 mg | ORAL_CAPSULE | Freq: Three times a day (TID) | ORAL | 0 refills | Status: DC

## 2018-10-07 NOTE — Progress Notes (Signed)
Date:  10/07/2018   Name:  Kristin Watkins   DOB:  14-Dec-1955   MRN:  [id number]   Chief Complaint: Cough (Cough and vomiting. No sleep because cough. Urinating on herself due to cough. Was clear production but now grey and green. Wheezing.  Facial pressure. Tried tylenol, mucinex, and tessalon pearls. )  Cough  The current episode started in the past 7 days. The problem has been unchanged. The problem occurs every few minutes. Associated symptoms include ear congestion, a fever, nasal congestion and a sore throat. Pertinent negatives include no chest pain, chills, ear pain, headaches, shortness of breath or wheezing. There is no history of environmental allergies.  Sinus Problem  This is a new problem. The current episode started in the past 7 days. The problem has been gradually worsening since onset. The pain is mild. Associated symptoms include congestion, coughing, a hoarse voice, sinus pressure and a sore throat. Pertinent negatives include no chills, ear pain, headaches or shortness of breath. Treatments tried: inhalers, singulair.    Review of Systems  Constitutional: Positive for fever. Negative for chills and fatigue.  HENT: Positive for congestion, hoarse voice, sinus pressure, sore throat and voice change. Negative for ear pain and trouble swallowing.   Eyes: Negative for visual disturbance.  Respiratory: Positive for cough and chest tightness. Negative for shortness of breath and wheezing.   Cardiovascular: Negative for chest pain and palpitations.  Allergic/Immunologic: Negative for environmental allergies.  Neurological: Negative for dizziness, light-headedness and headaches.  Psychiatric/Behavioral: Positive for sleep disturbance.    Patient Active Problem List   Diagnosis Date Noted  . Hormone replacement therapy (HRT) 03/28/2018  . Pre-diabetes 06/15/2017  . Mixed hyperlipidemia 06/15/2017  . Allergic rhinitis 10/15/2016  . Bursitis of hip 07/21/2015  .  Degeneration of intervertebral disc of lumbar region 07/21/2015  . Dysphagia 07/21/2015  . Asthma, mild intermittent 07/21/2015  . Acid reflux 07/21/2015  . Personal history of other malignant neoplasm of skin 11/22/2012    No Known Allergies  Past Surgical History:  Procedure Laterality Date  . BACK SURGERY     spinal stimulator  . ESOPHAGOGASTRODUODENOSCOPY  2015   normal    Social History   Tobacco Use  . Smoking status: Never Smoker  . Smokeless tobacco: Never Used  Substance Use Topics  . Alcohol use: Yes    Alcohol/week: 0.0 standard drinks  . Drug use: No     Medication list has been reviewed and updated.  Current Meds  Medication Sig  . albuterol (ACCUNEB) 0.63 MG/3ML nebulizer solution Take 1 ampule by nebulization every 6 (six) hours as needed for wheezing.  Marland Kitchen albuterol (PROAIR HFA) 108 (90 Base) MCG/ACT inhaler Inhale 2 puffs into the lungs every 6 (six) hours as needed for wheezing or shortness of breath.  . budesonide (PULMICORT) 180 MCG/ACT inhaler Inhale 1 puff into the lungs 2 (two) times daily.  . diazepam (VALIUM) 5 MG tablet Take 5 mg by mouth every 6 (six) hours as needed for anxiety.  . Diclofenac Potassium (CAMBIA) 50 MG PACK Take 50 mg by mouth daily.  . diclofenac sodium (VOLTAREN) 1 % GEL Place onto the skin.  Marland Kitchen ELESTRIN 0.52 MG/0.87 GM (0.06%) GEL APPLY TO AFFECTED AREA EVERY DAY  . gabapentin (NEURONTIN) 300 MG capsule   . lansoprazole (PREVACID) 30 MG capsule TAKE 1 CAPSULE (30 MG TOTAL) BY MOUTH DAILY AT 12 NOON.  . montelukast (SINGULAIR) 10 MG tablet Take 1 tablet (10 mg total) by mouth  daily.  . progesterone (PROMETRIUM) 200 MG capsule TAKE ONE CAPSULE BY MOUTH DAILY  . Testosterone 1.62 % GEL Apply 1.51VO (3 clicks) transdermally daily.  . traMADol-acetaminophen (ULTRACET) 37.5-325 MG tablet TAKE 1-2 TABLETS BY MOUTH TWICE DAILY AS NEEDED    PHQ 2/9 Scores 09/06/2018 06/15/2017  PHQ - 2 Score 0 0    Physical Exam Constitutional:       Appearance: She is well-developed.  HENT:     Right Ear: Ear canal and external ear normal. Tympanic membrane is not erythematous or retracted.     Left Ear: Ear canal and external ear normal. Tympanic membrane is not erythematous or retracted.     Nose:     Right Sinus: Maxillary sinus tenderness present. No frontal sinus tenderness.     Left Sinus: Maxillary sinus tenderness present. No frontal sinus tenderness.     Mouth/Throat:     Mouth: No oral lesions.     Pharynx: Uvula midline. Posterior oropharyngeal erythema present. No oropharyngeal exudate.  Neck:     Musculoskeletal: Normal range of motion and neck supple.  Cardiovascular:     Rate and Rhythm: Normal rate and regular rhythm.     Heart sounds: Normal heart sounds.  Pulmonary:     Breath sounds: Normal breath sounds. No decreased breath sounds, wheezing or rales.  Lymphadenopathy:     Cervical: No cervical adenopathy.  Neurological:     Mental Status: She is alert and oriented to person, place, and time.     BP 138/88   Pulse 93   Temp 98 F (36.7 C) (Oral)   Ht 5\' 5"  (1.651 m)   Wt 152 lb (68.9 kg)   SpO2 98%   BMI 25.29 kg/m   Assessment and Plan: 1. Acute non-recurrent sinusitis, unspecified location Continue asthma medications as prescribed Increase fluids - guaiFENesin-codeine (ROBITUSSIN AC) 100-10 MG/5ML syrup; Take 5 mLs by mouth 3 (three) times daily as needed for cough.  Dispense: 118 mL; Refill: 0 - benzonatate (TESSALON) 100 MG capsule; Take 1 capsule (100 mg total) by mouth 3 (three) times daily.  Dispense: 30 capsule; Refill: 0 - amoxicillin-clavulanate (AUGMENTIN) 875-125 MG tablet; Take 1 tablet by mouth 2 (two) times daily for 10 days.  Dispense: 20 tablet; Refill: 0   Partially dictated using Editor, commissioning. Any errors are unintentional.  Halina Maidens, MD [facility]  10/07/2018

## 2018-11-06 ENCOUNTER — Other Ambulatory Visit: Payer: Self-pay | Admitting: Internal Medicine

## 2018-11-06 DIAGNOSIS — J4520 Mild intermittent asthma, uncomplicated: Secondary | ICD-10-CM

## 2019-01-11 DIAGNOSIS — G894 Chronic pain syndrome: Secondary | ICD-10-CM | POA: Diagnosis not present

## 2019-01-16 DIAGNOSIS — G894 Chronic pain syndrome: Secondary | ICD-10-CM | POA: Diagnosis not present

## 2019-01-16 DIAGNOSIS — M7918 Myalgia, other site: Secondary | ICD-10-CM | POA: Diagnosis not present

## 2019-02-23 ENCOUNTER — Other Ambulatory Visit: Payer: Self-pay | Admitting: Internal Medicine

## 2019-02-23 DIAGNOSIS — J4520 Mild intermittent asthma, uncomplicated: Secondary | ICD-10-CM

## 2019-02-24 DIAGNOSIS — S76012A Strain of muscle, fascia and tendon of left hip, initial encounter: Secondary | ICD-10-CM | POA: Diagnosis not present

## 2019-03-09 ENCOUNTER — Other Ambulatory Visit: Payer: Self-pay

## 2019-03-09 DIAGNOSIS — J019 Acute sinusitis, unspecified: Secondary | ICD-10-CM

## 2019-03-09 MED ORDER — BUDESONIDE-FORMOTEROL FUMARATE 160-4.5 MCG/ACT IN AERO: 2.0000 | INHALATION_SPRAY | Freq: Two times a day (BID) | RESPIRATORY_TRACT | 5 refills | Status: DC

## 2019-03-13 ENCOUNTER — Other Ambulatory Visit: Payer: Self-pay

## 2019-04-12 ENCOUNTER — Other Ambulatory Visit: Payer: Self-pay | Admitting: Internal Medicine

## 2019-04-12 ENCOUNTER — Telehealth: Payer: Self-pay

## 2019-04-12 DIAGNOSIS — R21 Rash and other nonspecific skin eruption: Secondary | ICD-10-CM

## 2019-04-12 MED ORDER — TRIAMCINOLONE ACETONIDE 0.1 % EX CREA: 1.0000 "application " | TOPICAL_CREAM | Freq: Two times a day (BID) | CUTANEOUS | 0 refills | Status: DC

## 2019-04-12 NOTE — Telephone Encounter (Signed)
Patient informed. 

## 2019-04-12 NOTE — Telephone Encounter (Signed)
Pt called and left VM. Has had poison ivy for 2 days now. Has old Rx for  triamcinolone acetonide 0.1 % cream and its helping. Wants to know if we can send in a RF or would she need to be seen.  Will come in if needed. I do not see this in patient's medication hx.

## 2019-04-12 NOTE — Telephone Encounter (Signed)
I don't see where I ever prescribed this for her.  It must have been many years ago.  However, I sent in a new Rx to [facility] in [place].

## 2019-04-18 ENCOUNTER — Other Ambulatory Visit: Payer: Self-pay

## 2019-04-18 ENCOUNTER — Ambulatory Visit: Payer: BC Managed Care – PPO | Attending: Physical Medicine and Rehabilitation | Admitting: Physical Therapy

## 2019-04-18 DIAGNOSIS — R2689 Other abnormalities of gait and mobility: Secondary | ICD-10-CM | POA: Diagnosis not present

## 2019-04-18 DIAGNOSIS — M62838 Other muscle spasm: Secondary | ICD-10-CM | POA: Insufficient documentation

## 2019-04-18 DIAGNOSIS — M533 Sacrococcygeal disorders, not elsewhere classified: Secondary | ICD-10-CM | POA: Insufficient documentation

## 2019-04-18 NOTE — Patient Instructions (Addendum)
Work stretches:  Door way:  pect/ hip flexor, calf     small spinal side flexion/ rotation    _____  Walking with higher thighs, stronger push off, arm swings   ______  Stretches in morning and evening:   for thoracic rotation: ( sidelying arm/ elbow pull on edge of mattress with shoulder depression)  (open book  - 1/4 turn )     for postural stability:  ( deep core level 1 and 2 )

## 2019-04-18 NOTE — Addendum Note (Signed)
Addended by: Jerl Mina on: 04/18/2019 03:39 PM   Modules accepted: Orders

## 2019-04-18 NOTE — Therapy (Addendum)
[place] MAIN [facility] SERVICES [address] [place], [place], [address] Phone: [phone]   Fax:  [phone]  Physical Therapy Evaluation  Patient Details  Name: Kristin Watkins MRN: [id number] Date of Birth: 62-31-1957 Referring Provider (PT): Minna Antis Date: 04/18/2019  PT End of Session - 04/18/19 1314    Visit Number  1    Number of Visits  10    Date for PT Re-Evaluation  06/27/19    PT Start Time  1104    PT Stop Time  1230    PT Time Calculation (min)  86 min       Past Medical History:  Diagnosis Date  . Cancer ([place])    skin ca    Past Surgical History:  Procedure Laterality Date  . BACK SURGERY     spinal stimulator  . ESOPHAGOGASTRODUODENOSCOPY  2015   normal    There were no vitals filed for this visit.   Subjective Assessment - 04/18/19 1109    Subjective 1) L hip flexor pain:  Pt tore her L hip flexor in a lunge when watching a yoga video ( 3-week yoga retreat by [employer])  in March.  Pt was not able to flex her leg nor put on her shoes nor lift L leg afterwards. Labral tear was r/o by orthopedist.  Steriods helped. Since completing PT for over a month along with massage therapy, pt starting to be able to stand up straighter, the discomfort is gone, cross leg again.  Pain in L hip flexor 1-2/10 if aggravated.  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 Pt was walking her dog and was doing her yoga during this time. Currently physical activity: pt will not be sitting for long periods after this week as classes are over. Walking dog in the woods 30-90 min. Prior to injury, yoga video routine 3-5 x week. Strength training 2-3 x week: dumbbells . and will begin restistance bands with PT. Pt enjoys kayaking, biking, woodworking, gardening, cutting wood with woodchain.      2) B hip pain, localized pain , no radiating lateral thigh has been long standing prior to injury. 4/10, Pt is unable to  lay on her sides when sleeping. Pt feels it has to do with her spine. Pt feels tired in her thoracic spine with weedwacking machine.       Pertinent History  15 plus surgical interventions since 1989.Ranging from anterior fusions to post fusions with ORIF: L2-S1, C4-T1. SI joints chemically tightened over the course of 2 yrs- 1996 ish. SCS implanted in R belly  in 2010- new battery- 2019. Had issues with fusions breaking- or not healing, hardware malfunctions. I am pretty solid now- cervical sites a bit iffy. But works. History of loss of motor function in LLE, LUE Issues with loss of bowel and bladder. All resolved.    Currently in Pain?  Yes    Pain Score  2     Pain Location  Other (Comment)   thoracic        [facility] PT Assessment - 04/18/19 1217      Assessment   Medical Diagnosis  Chronic Cervical / Scapular/ Lumbar pain     Referring Provider (PT)  Claudia Desanctis       Restrictions   Weight Bearing Restrictions  No      Balance Screen   Has the patient fallen in the past 6 months  No  Prior Function   Level of Independence  Independent      Observation/Other Assessments   Observations  ankles crossed in sitting. supine leg length measure: 87 cm R, 88 cm L ( post Tx 87 cm with equal alignment of ASIS )       Coordination   Gross Motor Movements are Fluid and Coordinated  --   limited posterior excursion of diaphragm , overuse of gluts with pelvic floor contraction     Lunges   Comments  limited hip ext  poor coactivation of lower kinetic chain, supination, poor knee alignment       Posture/Postural Control   Posture Comments  perturbation in posterior sling test and deep core levle 2       AROM   Overall AROM Comments  Rotation at T-spine ~50%, sidebend ~20% ,  ( Post Tx: sideflexion 40% B       PROM   Overall PROM Comments  limited R hip ext ~5 deg, L hip ext ~20%       Strength   Overall Strength Comments  hip abd ( flexor dominant) B, R 3+/5, L 4+/5, hip ext B 3+/5  ( lacking full ext).  PF standing unilateral support UE on wall 15 rep PF        Palpation   Spinal mobility   increased tightness a tinterspinal L spine to sacrum, slight L thoracic convex, R lumbar curve    SI assessment   L sacrum limited in nutation , tenderness at superior aspect of sacrum    Palpation comment  L PSIS more posterior ( postTx: equally alignment, no iliac crest height difference)        Ambulation/Gait   Gait Comments  genu varus, supination, narrow BOS, L thoracic deviation, shift, L hip higher, minimi trunk rotation, / arm swing, ( Post Tx: L thoracic shift still present - due to sacrum fusion/ cervical/lumbar spine fusions), increased arm swing, thoracic rotation).                 Objective measurements completed on examination: See above findings.    Pelvic Floor Special Questions - 04/18/19 1318    Diastasis Recti  neg       [facility] Adult PT Treatment/Exercise - 04/18/19 1217      Neuro Re-ed    Neuro Re-ed Details   cued for proper technique for deep core , gait training- cued for increased hip flexion, less supination,       Modalities   Modalities  Moist Heat      Moist Heat Therapy   Number Minutes Moist Heat  10 Minutes   during instruction for HEP    Moist Heat Location  Lumbar Spine;Hip      Manual Therapy   Manual therapy comments  AP mob FADDER at L SIJ, PA mob at L sacrum to facilitate nutation, rotational mob on ilia, STM/ MWM along R interspinals at throacic/ medial scap mm, L QL / glut med.                    PT Long Term Goals - 04/18/19 1305      PT LONG TERM GOAL #1   Title  Pt will increase score on PSFS for "lay on side" 3pt to > 6pt, "lunge without pain and be steady" 6pt  >9pt, "lengthen anterior, strengthen posterior" 5pt > 8 pt    Time  10    Period  Weeks    Status  New    Target Date  06/27/19      PT LONG TERM GOAL #2   Title  Pt will demo dissassociation of trunk and pelvic/lower kinetic chain without  cues in order to progress to use of dumbbells/ resistance bands    Time  4    Period  Weeks    Status  New    Target Date  05/16/19      PT LONG TERM GOAL #3   Title  Pt will demo proper body mechanics to minimize straining of abdomen and pelvic floor with fitness routine (floor <> stand t/f , lunges to stand, modifications to sit-up/ crunches) ( lifting dumbbells, kettlebells, stand<> floor and sit to stand t/f and yoga postures)    Time  6    Period  Weeks    Status  New    Target Date  05/30/19      PT LONG TERM GOAL #4   Title  Pt will demo no lumbopelvic perturbations with posterior sling strength test   in order to minimize hip/ thoracic spine pain and perform ADLs/ fitness    Time  4    Period  Weeks    Status  New    Target Date  05/16/19      PT LONG TERM GOAL #5   Title  Pt will demo increased sacral nutation, no posterior rotation of L PSIS, increased hip ext to ~10 deg B, increased spinal sideflexion ~25% B in order to progress to yoga postures    Time  8    Period  Weeks    Status  New    Target Date  06/13/19      Additional Long Term Goals   Additional Long Term Goals  Yes      PT LONG TERM GOAL #6   Title  Pt will be IND with yoga posture modifications in standing, double kneeling, supine, sitting on the floor positions in order to minimzie risk for injuries    Time  8    Period  Weeks    Status  New    Target Date  06/13/19             Plan - 04/18/19 1314    Clinical Impression Statement Pt is a 63 yo female who reports L hip flexor pain with a yoga injury 3 months ago which has improved with PT and massage therapy. 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 Pt also has a longstanding B nonradiating hip pain that existed prior to yoga injury. This pain limits her ability to sleep on her side.   Clinical presentation includes spinal deviation ( L thoracic shift), increased spinal mm tightness, higher L ilic crest/  malleoli, limited L sacrum nutation, hypomobility of L SIJ, deep core weakness, limited hip ext, gait deviations           ( limited trunk rotation, supination on stance, genu varus, narrow BOS  limited hip flexion during swing, L lateral hip sway).    Following Tx today, pt demo'd more equally aligned pelvic girdle, increased mobility of L sacrum/ SIJ/ spine, less pelvic sway and increased trunk mobility during gait. Pt will benefit from regional interdependent approach to optimize lower kinetic chain, correct supination and decreased coactivation of  transverse arch/ plantar arch to minimize B hip pain and prepare to return to yoga with less risk for injuries.      Personal Factors and  Comorbidities  Other;Fitness   cervical/ lumbar / sacral surgeries ( fusion), SCS   Examination-Activity Limitations  Sleep;Other   practicing yoga postures, lunges   Stability/Clinical Decision Making  Evolving/Moderate complexity    Rehab Potential  Good    PT Frequency  1x / week    PT Duration  Other (comment)   10   PT Treatment/Interventions  ADLs/Self Care Home Management;Neuromuscular re-education;Functional mobility training;Therapeutic activities;Moist Heat;Scar mobilization;Manual techniques;Patient/family education;Therapeutic exercise;Balance training;Gait training    Consulted and Agree with Plan of Care  Patient       Patient will benefit from skilled therapeutic intervention in order to improve the following deficits and impairments:  Abnormal gait, Hypomobility, Decreased strength, Decreased mobility, Decreased safety awareness, Decreased endurance, Decreased activity tolerance, Increased muscle spasms, Improper body mechanics, Postural dysfunction, Difficulty walking, Decreased coordination, Decreased balance  Visit Diagnosis: 1. Sacrococcygeal disorders, not elsewhere classified   2. Other abnormalities of gait and mobility   3. Other muscle spasm        Problem List Patient Active  Problem List   Diagnosis Date Noted  . Hormone replacement therapy (HRT) 03/28/2018  . Pre-diabetes 06/15/2017  . Mixed hyperlipidemia 06/15/2017  . Allergic rhinitis 10/15/2016  . Bursitis of hip 07/21/2015  . Degeneration of intervertebral disc of lumbar region 07/21/2015  . Dysphagia 07/21/2015  . Asthma, mild intermittent 07/21/2015  . Acid reflux 07/21/2015  . Personal history of other malignant neoplasm of skin 11/22/2012    Jerl Mina ,PT, DPT, E-RYT  04/18/2019, 2:18 PM  [place] MAIN [facility] SERVICES [address] [place], [place], [address] Phone: [phone]   Fax:  [phone]  Name: Brylea Pita MRN: [id number] Date of Birth: 23-Nov-1955

## 2019-04-21 ENCOUNTER — Other Ambulatory Visit: Payer: Self-pay

## 2019-04-21 DIAGNOSIS — R21 Rash and other nonspecific skin eruption: Secondary | ICD-10-CM

## 2019-04-21 MED ORDER — TRIAMCINOLONE ACETONIDE 0.1 % EX CREA: 1.0000 "application " | TOPICAL_CREAM | Freq: Two times a day (BID) | CUTANEOUS | 0 refills | Status: AC

## 2019-04-25 ENCOUNTER — Ambulatory Visit: Payer: BC Managed Care – PPO | Admitting: Physical Therapy

## 2019-05-02 ENCOUNTER — Other Ambulatory Visit: Payer: Self-pay

## 2019-05-02 ENCOUNTER — Ambulatory Visit: Payer: BC Managed Care – PPO | Attending: Physical Medicine and Rehabilitation | Admitting: Physical Therapy

## 2019-05-02 DIAGNOSIS — R2689 Other abnormalities of gait and mobility: Secondary | ICD-10-CM | POA: Insufficient documentation

## 2019-05-02 DIAGNOSIS — M62838 Other muscle spasm: Secondary | ICD-10-CM | POA: Diagnosis not present

## 2019-05-02 DIAGNOSIS — M533 Sacrococcygeal disorders, not elsewhere classified: Secondary | ICD-10-CM | POA: Diagnosis not present

## 2019-05-02 NOTE — Patient Instructions (Signed)
Sushi mat pillow  Sidelying / mattress rotation Reclined twist with pillows/ props ONLY  _shift hips to the R, _drop knees to L _ chintuck, rotate R neck    6 directions  / loosen jaw  Open book  Deep core level 1 and 2   Modified angel ( bat)                   During the day Neck  Thoracic  Lumbar  Feet   L temple/ L arm by the wall - J scoop Bear scratch at the door way  Sidelflexion/ rotation  15 heel raises,   R arm on the wall - L sideflexion

## 2019-05-02 NOTE — Therapy (Addendum)
[place] MAIN [facility] SERVICES [address] [place], [place], [address] Phone: [phone]   Fax:  [phone]  Physical Therapy Treatment  Patient Details  Name: Kristin Watkins MRN: [id number] Date of Birth: September 23, 1955 Referring Provider (PT): Minna Antis Date: 05/02/2019  PT End of Session - 05/02/19 1228    Visit Number  2    Number of Visits  10    Date for PT Re-Evaluation  06/27/19    PT Start Time  1110    PT Stop Time  1228    PT Time Calculation (min)  78 min    Activity Tolerance  Patient tolerated treatment well    Behavior During Therapy  [facility] for tasks assessed/performed       Past Medical History:  Diagnosis Date  . Cancer ([place])    skin ca    Past Surgical History:  Procedure Laterality Date  . BACK SURGERY     spinal stimulator  . ESOPHAGOGASTRODUODENOSCOPY  2015   normal    There were no vitals filed for this visit.  Subjective Assessment - 05/02/19 1825    Subjective  Pt reported she found it difficult to perform the pause with the deep core exercise. Pt has been practicing her spinal exercises without issues. Pt experienced soreness at her SIJ after last session for one day.    Pertinent History  15 plus surgical interventions since 1989.Ranging from anterior fusions to post fusions with ORIF: L2-S1, C4-T1. SI joints chemically tightened over the course of 2 yrs- 1996 ish. SCS implanted in R belly  in 2010- new battery- 2019. Had issues with fusions breaking- or not healing, hardware malfunctions. I am pretty solid now- cervical sites a bit iffy. But works. History of loss of motor function in LLE, LUE Issues with loss of bowel and bladder. All resolved.    Patient Stated Goals  "lay on side" , "lunge without pain and be steady" , "lengthen anterior, strengthen posterior"         [facility] PT Assessment - 05/02/19 1819      Coordination   Gross Motor Movements are Fluid and Coordinated  --   poor  coordination of cervical spine, excessive cues for retr     Palpation   Spinal mobility  increased tightness on R> L at occiput mm, levator,  R medial scap/ SA/ posterior/lateral intercostal mm     SI assessment   equal alignment, increased mobility at SIJ                    [facility] Adult PT Treatment/Exercise - 05/02/19 1804      Neuro Re-ed    Neuro Re-ed Details   cued for cervical propioception for more retraction       Modalities   Modalities  Moist Heat      Moist Heat Therapy   Number Minutes Moist Heat  10 Minutes    Moist Heat Location  --   thoracic / during exercises      Manual Therapy   Manual therapy comments  STM/MWM to decrease tightness along interspinal/ paraspinals/ mm medial scap on L, R occiput                    PT Long Term Goals - 04/18/19 1305      PT LONG TERM GOAL #1   Title  Pt will increase score on PSFS for "lay on side" 3pt to >  6pt, "lunge without pain and be steady" 6pt  >9pt, "lengthen anterior, strengthen posterior" 5pt > 8 pt    Time  10    Period  Weeks    Status  New    Target Date  06/27/19      PT LONG TERM GOAL #2   Title  Pt will demo dissassociation of trunk and pelvic/lower kinetic chain without cues in order to progress to use of dumbbells/ resistance bands    Time  4    Period  Weeks    Status  New    Target Date  05/16/19      PT LONG TERM GOAL #3   Title  Pt will demo proper body mechanics to minimize straining of abdomen and pelvic floor with fitness routine (floor <> stand t/f , lunges to stand, modifications to sit-up/ crunches) ( lifting dumbbells, kettlebells, stand<> floor and sit to stand t/f and yoga postures)    Time  6    Period  Weeks    Status  New    Target Date  05/30/19      PT LONG TERM GOAL #4   Title  Pt will demo no lumbopelvic perturbations with posterior sling strength test   in order to minimize hip/ thoracic spine pain and perform ADLs/ fitness    Time  4    Period  Weeks     Status  New    Target Date  05/16/19      PT LONG TERM GOAL #5   Title  Pt will demo increased sacral nutation, no posterior rotation of L PSIS, increased hip ext to ~10 deg B, increased spinal sideflexion ~25% B in order to progress to yoga postures    Time  8    Period  Weeks    Status  New    Target Date  06/13/19      Additional Long Term Goals   Additional Long Term Goals  Yes      PT LONG TERM GOAL #6   Title  Pt will be IND with yoga posture modifications in standing, double kneeling, supine, sitting on the floor positions in order to minimzie risk for injuries    Time  8    Period  Weeks    Status  New    Target Date  06/13/19            Plan - 05/02/19 1804    Clinical Impression Statement  Pt showed good carry over with pelvic alignment and SIJ mobility withlast session's manual Tx. Addressed pt's L lateral shift of thorax. R convex curve present at thoracic spine with L shoulder higher, increased L paraspinal/ interspinal mm / medial scapular mm tightness, R levator mm, limited cervical rotation. Following manual Tx, pt achieved increased R shoulder downward rotation, R cervical/ thoracic rotation post Tx. Specific stretches provided into HEP and plan to add strengthening/ lengthening exercises at next session. Provided customized yoga posture for lengthening spine and plan to postpone introduction of typical yoga poses after pt has maintained specific HEP for several weeks in order to balance out mm imbalances/ spinal hypomobility. Pt continues to benefit from skilled PT.    Personal Factors and Comorbidities  Other;Fitness   cervical/ lumbar / sacral surgeries ( fusion), SCS   Examination-Activity Limitations  Sleep;Other   practicing yoga postures, lunges   Stability/Clinical Decision Making  Evolving/Moderate complexity    Rehab Potential  Good    PT Frequency  1x / week  PT Duration  Other (comment)   10   PT Treatment/Interventions  ADLs/Self Care Home  Management;Neuromuscular re-education;Functional mobility training;Therapeutic activities;Moist Heat;Scar mobilization;Manual techniques;Patient/family education;Therapeutic exercise;Balance training;Gait training    Consulted and Agree with Plan of Care  Patient       Patient will benefit from skilled therapeutic intervention in order to improve the following deficits and impairments:  Abnormal gait, Hypomobility, Decreased strength, Decreased mobility, Decreased safety awareness, Decreased endurance, Decreased activity tolerance, Increased muscle spasms, Improper body mechanics, Postural dysfunction, Difficulty walking, Decreased coordination, Decreased balance  Visit Diagnosis: 1. Sacrococcygeal disorders, not elsewhere classified   2. Other abnormalities of gait and mobility   3. Other muscle spasm        Problem List Patient Active Problem List   Diagnosis Date Noted  . Hormone replacement therapy (HRT) 03/28/2018  . Pre-diabetes 06/15/2017  . Mixed hyperlipidemia 06/15/2017  . Allergic rhinitis 10/15/2016  . Bursitis of hip 07/21/2015  . Degeneration of intervertebral disc of lumbar region 07/21/2015  . Dysphagia 07/21/2015  . Asthma, mild intermittent 07/21/2015  . Acid reflux 07/21/2015  . Personal history of other malignant neoplasm of skin 11/22/2012    Jerl Mina ,PT, DPT, E-RYT  05/02/2019, 6:26 PM  [place] MAIN [facility] SERVICES [address] [place], [place], [address] Phone: [phone]   Fax:  [phone]  Name: Kristin Watkins MRN: [id number] Date of Birth: [birth year]

## 2019-05-09 ENCOUNTER — Ambulatory Visit: Payer: BC Managed Care – PPO | Admitting: Physical Therapy

## 2019-05-12 ENCOUNTER — Ambulatory Visit: Payer: BC Managed Care – PPO | Admitting: Physical Therapy

## 2019-05-16 ENCOUNTER — Ambulatory Visit: Payer: BC Managed Care – PPO | Admitting: Physical Therapy

## 2019-05-23 ENCOUNTER — Ambulatory Visit: Payer: BC Managed Care – PPO | Admitting: Physical Therapy

## 2019-07-27 ENCOUNTER — Other Ambulatory Visit: Payer: Self-pay | Admitting: Internal Medicine

## 2019-09-02 ENCOUNTER — Other Ambulatory Visit: Payer: Self-pay | Admitting: Internal Medicine

## 2019-09-02 DIAGNOSIS — J309 Allergic rhinitis, unspecified: Secondary | ICD-10-CM

## 2019-09-22 ENCOUNTER — Other Ambulatory Visit: Payer: Self-pay | Admitting: Internal Medicine

## 2019-09-22 DIAGNOSIS — G4484 Primary exertional headache: Secondary | ICD-10-CM

## 2019-10-04 DIAGNOSIS — Z85828 Personal history of other malignant neoplasm of skin: Secondary | ICD-10-CM | POA: Diagnosis not present

## 2019-10-04 DIAGNOSIS — Z86018 Personal history of other benign neoplasm: Secondary | ICD-10-CM | POA: Diagnosis not present

## 2019-10-04 DIAGNOSIS — Z872 Personal history of diseases of the skin and subcutaneous tissue: Secondary | ICD-10-CM | POA: Diagnosis not present

## 2019-10-04 DIAGNOSIS — L578 Other skin changes due to chronic exposure to nonionizing radiation: Secondary | ICD-10-CM | POA: Diagnosis not present

## 2019-11-04 ENCOUNTER — Other Ambulatory Visit: Payer: Self-pay | Admitting: Internal Medicine

## 2019-11-04 DIAGNOSIS — N951 Menopausal and female climacteric states: Secondary | ICD-10-CM

## 2019-11-15 ENCOUNTER — Encounter: Payer: Self-pay | Admitting: Internal Medicine

## 2019-11-28 ENCOUNTER — Telehealth: Payer: Self-pay

## 2019-11-28 ENCOUNTER — Other Ambulatory Visit: Payer: Self-pay | Admitting: Internal Medicine

## 2019-11-28 ENCOUNTER — Encounter: Payer: Self-pay | Admitting: Internal Medicine

## 2019-11-28 NOTE — Telephone Encounter (Signed)
Pt called to get return to work note dated for tomorrow.  Please advise?

## 2019-12-06 DIAGNOSIS — M1611 Unilateral primary osteoarthritis, right hip: Secondary | ICD-10-CM | POA: Diagnosis not present

## 2019-12-06 DIAGNOSIS — M7061 Trochanteric bursitis, right hip: Secondary | ICD-10-CM | POA: Diagnosis not present

## 2019-12-25 ENCOUNTER — Telehealth: Payer: Self-pay

## 2019-12-25 NOTE — Telephone Encounter (Signed)
Left message on vm to return call to schedule an appt/ physical

## 2020-01-03 DIAGNOSIS — M7061 Trochanteric bursitis, right hip: Secondary | ICD-10-CM | POA: Diagnosis not present

## 2020-01-10 ENCOUNTER — Encounter: Payer: Self-pay | Admitting: Internal Medicine

## 2020-01-10 ENCOUNTER — Other Ambulatory Visit: Payer: Self-pay

## 2020-01-10 ENCOUNTER — Ambulatory Visit (INDEPENDENT_AMBULATORY_CARE_PROVIDER_SITE_OTHER): Payer: BC Managed Care – PPO | Admitting: Internal Medicine

## 2020-01-10 VITALS — BP 132/80 | HR 100 | Temp 97.1°F | Ht 65.0 in | Wt 142.0 lb

## 2020-01-10 DIAGNOSIS — R7303 Prediabetes: Secondary | ICD-10-CM

## 2020-01-10 DIAGNOSIS — Z0000 Encounter for general adult medical examination without abnormal findings: Secondary | ICD-10-CM

## 2020-01-10 DIAGNOSIS — M5136 Other intervertebral disc degeneration, lumbar region: Secondary | ICD-10-CM

## 2020-01-10 DIAGNOSIS — J4520 Mild intermittent asthma, uncomplicated: Secondary | ICD-10-CM | POA: Diagnosis not present

## 2020-01-10 DIAGNOSIS — Z1231 Encounter for screening mammogram for malignant neoplasm of breast: Secondary | ICD-10-CM

## 2020-01-10 DIAGNOSIS — K219 Gastro-esophageal reflux disease without esophagitis: Secondary | ICD-10-CM

## 2020-01-10 DIAGNOSIS — Z13820 Encounter for screening for osteoporosis: Secondary | ICD-10-CM

## 2020-01-10 DIAGNOSIS — E782 Mixed hyperlipidemia: Secondary | ICD-10-CM | POA: Diagnosis not present

## 2020-01-10 LAB — POCT URINALYSIS DIPSTICK
Bilirubin, UA: NEGATIVE
Blood, UA: NEGATIVE
Glucose, UA: NEGATIVE
Ketones, UA: NEGATIVE
Leukocytes, UA: NEGATIVE
Nitrite, UA: NEGATIVE
Protein, UA: NEGATIVE
Spec Grav, UA: <=1.005 — AB (ref 1.010–1.025)
Urobilinogen, UA: 0.2 E.U./dL
pH, UA: 5 (ref 5.0–8.0)

## 2020-01-10 MED ORDER — DICLOFENAC SODIUM 1 % EX GEL: 4.0000 g | Freq: Four times a day (QID) | CUTANEOUS | 2 refills | Status: AC

## 2020-01-10 NOTE — Progress Notes (Signed)
Date:  01/10/2020   Name:  Kristin Watkins   DOB:  1956/08/17   MRN:  [id number]   Chief Complaint: Annual Exam (Refused pap and breast exam. Refused colonoscopy. Getting treatment for hip and that is all. ) Marchae Hagerty is a 64 y.o. female who presents today for her Complete Annual Exam. She feels fairly well. She reports exercising walking 3.5 miles per day. She reports she is sleeping poorly - mainly anxiety over Covid.  Colonoscopy none scheduled in 2018 but then cancelled - will do it once Covid has quieted Mammogram  09/2018 DEXA none but wants to get one at [facility] Pap - declines Immunization History  Administered Date(s) Administered  . Influenza, Quadrivalent, Recombinant, Inj, Pf 06/27/2019  . Influenza,inj,Quad PF,6+ Mos 09/06/2018  . Influenza-Unspecified 06/27/2019  . PFIZER SARS-COV-2 Vaccination 11/24/2019, 12/18/2019  . Tdap 08/03/2012, 01/06/2016     Asthma There is no cough, shortness of breath or wheezing. This is a recurrent problem. The problem occurs intermittently. Pertinent negatives include no chest pain, fever, headaches, heartburn, sore throat or trouble swallowing. Her symptoms are alleviated by beta-agonist, leukotriene antagonist and steroid inhaler. She reports significant improvement on treatment. Her past medical history is significant for asthma.  Gastroesophageal Reflux She reports no abdominal pain, no chest pain, no coughing, no heartburn, no sore throat, no water brash or no wheezing. The problem occurs rarely. Pertinent negatives include no fatigue. She has tried a PPI (PPI as needed) for the symptoms. The treatment provided significant relief. Past procedures include an EGD. followed by GI.  Diabetes She presents for her follow-up diabetic visit. Diabetes type: prediabetes. Her disease course has been stable. Hypoglycemia symptoms include nervousness/anxiousness. Pertinent negatives for hypoglycemia include no dizziness, headaches or tremors.  Pertinent negatives for diabetes include no chest pain, no fatigue, no polydipsia and no polyuria.  Hip pain - She has been working with Ortho and PTx; recently had steroid injections and is doing better.  Also taking Mobic and would like Voltaren gel.  Lab Results  Component Value Date   CREATININE 0.92 06/15/2017   BUN 13 06/15/2017   NA 141 06/15/2017   K 4.1 06/15/2017   CL 100 06/15/2017   CO2 25 06/15/2017   Lab Results  Component Value Date   CHOL 234 (H) 06/15/2017   HDL 73 06/15/2017   LDLCALC 141 (H) 06/15/2017   TRIG 102 06/15/2017   CHOLHDL 3.2 06/15/2017   Lab Results  Component Value Date   TSH 1.310 06/15/2017   Lab Results  Component Value Date   HGBA1C 5.8 (H) 06/15/2017   Lab Results  Component Value Date   WBC 7.3 10/09/2011   HGB 14.0 10/09/2011   HCT 41.4 10/09/2011   MCV 90 10/09/2011   PLT 220 10/09/2011   Lab Results  Component Value Date   ALT 11 06/15/2017   AST 15 06/15/2017   ALKPHOS 65 06/15/2017   BILITOT 0.5 06/15/2017     Review of Systems  Constitutional: Positive for unexpected weight change (worked to lose 15 lbs she gained last year). Negative for chills, fatigue and fever.  HENT: Negative for congestion, hearing loss, sore throat, tinnitus, trouble swallowing and voice change.   Eyes: Negative for visual disturbance.  Respiratory: Negative for cough, chest tightness, shortness of breath and wheezing.        Asthma has been well controlled  Cardiovascular: Negative for chest pain, palpitations and leg swelling.  Gastrointestinal: Negative for abdominal pain, constipation, diarrhea,  heartburn and vomiting.  Endocrine: Negative for polydipsia and polyuria.  Genitourinary: Negative for dysuria, frequency, genital sores and vaginal bleeding.  Musculoskeletal: Positive for back pain (stable, uses spinal stimulator as needed). Negative for arthralgias (both hips from prolonged sitting), gait problem and joint swelling.  Skin:  Negative for color change and rash.  Neurological: Negative for dizziness, tremors, light-headedness and headaches.  Hematological: Negative for adenopathy. Does not bruise/bleed easily.  Psychiatric/Behavioral: Positive for sleep disturbance. Negative for dysphoric mood. The patient is nervous/anxious.     Patient Active Problem List   Diagnosis Date Noted  . Hormone replacement therapy (HRT) 03/28/2018  . Pre-diabetes 06/15/2017  . Mixed hyperlipidemia 06/15/2017  . Allergic rhinitis 10/15/2016  . Bursitis of hip 07/21/2015  . Degeneration of intervertebral disc of lumbar region 07/21/2015  . Dysphagia 07/21/2015  . Asthma, mild intermittent 07/21/2015  . Acid reflux 07/21/2015  . Personal history of other malignant neoplasm of skin 11/22/2012    No Known Allergies  Past Surgical History:  Procedure Laterality Date  . BACK SURGERY     spinal stimulator  . ESOPHAGOGASTRODUODENOSCOPY  2015   normal    Social History   Tobacco Use  . Smoking status: Never Smoker  . Smokeless tobacco: Never Used  Substance Use Topics  . Alcohol use: Yes    Alcohol/week: 0.0 standard drinks  . Drug use: No     Medication list has been reviewed and updated.  Current Meds  Medication Sig  . albuterol (ACCUNEB) 0.63 MG/3ML nebulizer solution Take 1 ampule by nebulization every 6 (six) hours as needed for wheezing.  Marland Kitchen albuterol (PROAIR HFA) 108 (90 Base) MCG/ACT inhaler Inhale 2 puffs into the lungs every 6 (six) hours as needed for wheezing or shortness of breath.  . budesonide-formoterol (SYMBICORT) 160-4.5 MCG/ACT inhaler Inhale 2 puffs into the lungs 2 (two) times daily.  Marland Kitchen CAMBIA 50 MG PACK TAKE 50 MG BY MOUTH DAILY.  . diazepam (VALIUM) 5 MG tablet Take 5 mg by mouth every 6 (six) hours as needed for anxiety.  . diclofenac (VOLTAREN) 75 MG EC tablet Take 1 tablet by mouth 2 (two) times a day.  . diclofenac sodium (VOLTAREN) 1 % GEL Place onto the skin.  Marland Kitchen ELESTRIN 0.52 MG/0.87 GM  (0.06%) GEL APPLY TOPICALLY TO AFFECTED AREA EVERY DAY  . gabapentin (NEURONTIN) 300 MG capsule Take 300 mg by mouth daily.   . lansoprazole (PREVACID) 30 MG capsule TAKE 1 CAPSULE (30 MG TOTAL) BY MOUTH DAILY AT 12 NOON.  . meloxicam (MOBIC) 7.5 MG tablet Take 7.5 mg by mouth in the morning and at bedtime.  . methocarbamol (ROBAXIN) 500 MG tablet TAKE 1 2 TABLET BY MOUTH 3X A DAY AS SNEEDED FOR MUSCLE PAIN  . montelukast (SINGULAIR) 10 MG tablet TAKE 1 TABLET BY MOUTH EVERY DAY  . progesterone (PROMETRIUM) 200 MG capsule TAKE ONE CAPSULE BY MOUTH DAILY  . PULMICORT FLEXHALER 180 MCG/ACT inhaler INHALE 1 PUFF BY MOUTH TWICE A DAY  . Testosterone 1.62 % GEL Apply [id number] (3 clicks) transdermally daily.  . traMADol-acetaminophen (ULTRACET) 37.5-325 MG tablet TAKE 1-2 TABLETS BY MOUTH TWICE DAILY AS NEEDED  . triamcinolone cream (KENALOG) 0.1 % Apply 1 application topically 2 (two) times daily.    PHQ 2/9 Scores 01/10/2020 09/06/2018 06/15/2017  PHQ - 2 Score - 0 0  Exception Documentation Medical reason - -    BP Readings from Last 3 Encounters:  01/10/20 132/80  10/07/18 138/88  09/06/18 130/76  Physical Exam Vitals and nursing note reviewed.  Constitutional:      General: She is not in acute distress.    Appearance: She is well-developed.  HENT:     Head: Normocephalic and atraumatic.     Right Ear: Tympanic membrane and ear canal normal.     Left Ear: Tympanic membrane and ear canal normal.     Nose:     Right Sinus: No maxillary sinus tenderness.     Left Sinus: No maxillary sinus tenderness.  Eyes:     General: No scleral icterus.       Right eye: No discharge.        Left eye: No discharge.     Conjunctiva/sclera: Conjunctivae normal.  Neck:     Thyroid: No thyromegaly.     Vascular: No carotid bruit.  Cardiovascular:     Rate and Rhythm: Normal rate and regular rhythm.     Pulses: Normal pulses.     Heart sounds: Normal heart sounds.  Pulmonary:     Effort:  Pulmonary effort is normal. No respiratory distress.     Breath sounds: No wheezing.  Abdominal:     General: Bowel sounds are normal.     Palpations: Abdomen is soft.     Tenderness: There is no abdominal tenderness.    Musculoskeletal:        General: Normal range of motion.     Cervical back: Normal range of motion. No erythema.  Lymphadenopathy:     Cervical: No cervical adenopathy.  Skin:    General: Skin is warm and dry.     Findings: No rash.  Neurological:     Mental Status: She is alert and oriented to person, place, and time.     Cranial Nerves: No cranial nerve deficit.     Sensory: No sensory deficit.     Deep Tendon Reflexes: Reflexes are normal and symmetric.     Reflex Scores:      Bicep reflexes are 2+ on the right side and 2+ on the left side.      Patellar reflexes are 2+ on the right side and 2+ on the left side. Psychiatric:        Attention and Perception: Attention normal.        Mood and Affect: Mood normal.        Speech: Speech normal.        Behavior: Behavior normal.     Wt Readings from Last 3 Encounters:  01/10/20 142 lb (64.4 kg)  10/07/18 152 lb (68.9 kg)  09/06/18 148 lb (67.1 kg)    BP 132/80   Pulse 100   Temp (!) 97.1 F (36.2 C) (Temporal)   Ht 5\' 5"  (1.651 m)   Wt 142 lb (64.4 kg)   SpO2 97%   BMI 23.63 kg/m   Assessment and Plan: 1. Annual physical exam Normal exam Pt declines Pap - TSH - POCT urinalysis dipstick  2. Encounter for screening mammogram for breast cancer Will schedule at [facility]; Future  3. Mild intermittent asthma without complication Symptoms are very well controlled on Symbicort.  She uses Albuterol rarely.  4. Pre-diabetes Doing well with diet, recent weight loss - Comprehensive metabolic panel - Hemoglobin A1c  5. Mixed hyperlipidemia Check labs; continue healthy diet/exercise - Lipid panel  6. Gastroesophageal reflux disease without esophagitis No red flag  signs such as weight loss, n/v, melena Will continue PPI prn only. - CBC with  Differential/Platelet  7. Degeneration of intervertebral disc of lumbar region Has spinal stimulator to use as needed/battery changed in the past 2 years - diclofenac Sodium (VOLTAREN) 1 % GEL; Apply 4 g topically 4 (four) times daily.  Dispense: 150 g; Refill: 2  8. Encounter for screening for osteoporosis She will have this done at [facility] and route report to me - DG Bone Density; Future   Partially dictated using [employer]. Any errors are unintentional.  Halina Maidens, MD [facility]  01/10/2020

## 2020-01-11 LAB — COMPREHENSIVE METABOLIC PANEL
ALT: 13 IU/L (ref 0–32)
AST: 16 IU/L (ref 0–40)
Albumin/Globulin Ratio: 2.1 (ref 1.2–2.2)
Albumin: 4.4 g/dL (ref 3.8–4.8)
Alkaline Phosphatase: 54 IU/L (ref 39–117)
BUN/Creatinine Ratio: 15 (ref 12–28)
BUN: 11 mg/dL (ref 8–27)
Bilirubin Total: 0.4 mg/dL (ref 0.0–1.2)
CO2: 23 mmol/L (ref 20–29)
Calcium: 9.5 mg/dL (ref 8.7–10.3)
Chloride: 107 mmol/L — ABNORMAL HIGH (ref 96–106)
Creatinine, Ser: 0.74 mg/dL (ref 0.57–1.00)
GFR calc Af Amer: 100 mL/min/{1.73_m2} (ref >59)
GFR calc non Af Amer: 87 mL/min/{1.73_m2} (ref >59)
Globulin, Total: 2.1 g/dL (ref 1.5–4.5)
Glucose: 106 mg/dL — ABNORMAL HIGH (ref 65–99)
Potassium: 4.3 mmol/L (ref 3.5–5.2)
Sodium: 144 mmol/L (ref 134–144)
Total Protein: 6.5 g/dL (ref 6.0–8.5)

## 2020-01-11 LAB — CBC WITH DIFFERENTIAL/PLATELET
Basophils Absolute: 0.1 10*3/uL (ref 0.0–0.2)
Basos: 1 %
EOS (ABSOLUTE): 0.2 10*3/uL (ref 0.0–0.4)
Eos: 3 %
Hematocrit: 43.2 % (ref 34.0–46.6)
Hemoglobin: 14.4 g/dL (ref 11.1–15.9)
Immature Grans (Abs): 0 10*3/uL (ref 0.0–0.1)
Immature Granulocytes: 0 %
Lymphocytes Absolute: 1.1 10*3/uL (ref 0.7–3.1)
Lymphs: 16 %
MCH: 29.9 pg (ref 26.6–33.0)
MCHC: 33.3 g/dL (ref 31.5–35.7)
MCV: 90 fL (ref 79–97)
Monocytes Absolute: 0.5 10*3/uL (ref 0.1–0.9)
Monocytes: 7 %
Neutrophils Absolute: 5 10*3/uL (ref 1.4–7.0)
Neutrophils: 73 %
Platelets: 257 10*3/uL (ref 150–450)
RBC: 4.82 x10E6/uL (ref 3.77–5.28)
RDW: 13 % (ref 11.7–15.4)
WBC: 6.8 10*3/uL (ref 3.4–10.8)

## 2020-01-11 LAB — LIPID PANEL
Chol/HDL Ratio: 3 ratio (ref 0.0–4.4)
Cholesterol, Total: 240 mg/dL — ABNORMAL HIGH (ref 100–199)
HDL: 80 mg/dL (ref >39)
LDL Chol Calc (NIH): 141 mg/dL — ABNORMAL HIGH (ref 0–99)
Triglycerides: 112 mg/dL (ref 0–149)
VLDL Cholesterol Cal: 19 mg/dL (ref 5–40)

## 2020-01-11 LAB — TSH: TSH: 0.844 u[IU]/mL (ref 0.450–4.500)

## 2020-01-11 LAB — HEMOGLOBIN A1C
Est. average glucose Bld gHb Est-mCnc: 126 mg/dL
Hgb A1c MFr Bld: 6 % — ABNORMAL HIGH (ref 4.8–5.6)

## 2020-02-05 ENCOUNTER — Other Ambulatory Visit: Payer: Self-pay

## 2020-02-05 ENCOUNTER — Ambulatory Visit
Admission: RE | Admit: 2020-02-05 | Discharge: 2020-02-05 | Disposition: A | Discharge status: Home / Self Care | Payer: BC Managed Care – PPO | Source: Ambulatory Visit | Attending: Internal Medicine | Admitting: Internal Medicine

## 2020-02-05 ENCOUNTER — Encounter: Payer: Self-pay | Admitting: Internal Medicine

## 2020-02-05 DIAGNOSIS — Z1231 Encounter for screening mammogram for malignant neoplasm of breast: Secondary | ICD-10-CM | POA: Diagnosis not present

## 2020-02-20 ENCOUNTER — Ambulatory Visit: Payer: BC Managed Care – PPO | Admitting: Nurse Practitioner

## 2020-02-26 ENCOUNTER — Ambulatory Visit: Payer: BC Managed Care – PPO | Admitting: Nurse Practitioner

## 2020-03-04 DIAGNOSIS — M25551 Pain in right hip: Secondary | ICD-10-CM | POA: Diagnosis not present

## 2020-03-04 DIAGNOSIS — M5136 Other intervertebral disc degeneration, lumbar region: Secondary | ICD-10-CM | POA: Diagnosis not present

## 2020-03-04 DIAGNOSIS — G894 Chronic pain syndrome: Secondary | ICD-10-CM | POA: Diagnosis not present

## 2020-03-22 DIAGNOSIS — M5412 Radiculopathy, cervical region: Secondary | ICD-10-CM | POA: Insufficient documentation

## 2020-03-29 ENCOUNTER — Telehealth: Payer: Self-pay | Admitting: Internal Medicine

## 2020-03-29 ENCOUNTER — Encounter: Payer: Self-pay | Admitting: Internal Medicine

## 2020-03-29 NOTE — Telephone Encounter (Signed)
Please Advise.  KP

## 2020-03-29 NOTE — Telephone Encounter (Unsigned)
Copied from [place] [id number]. Topic: General - Other >> Mar 29, 2020  1:06 PM Keene Breath wrote: Reason for CRM: Patient called to let the nurse know that she is having some neck pain and spasms.  She wants to have some imaging done.  Please advise and call patient to discuss at [phone]

## 2020-03-29 NOTE — Telephone Encounter (Signed)
Please schedule pt appt to be seen.  Thank you, KP

## 2020-04-01 ENCOUNTER — Encounter: Payer: Self-pay | Admitting: Internal Medicine

## 2020-04-01 ENCOUNTER — Other Ambulatory Visit: Payer: Self-pay | Admitting: Physical Medicine and Rehabilitation

## 2020-04-01 ENCOUNTER — Ambulatory Visit: Payer: BC Managed Care – PPO | Admitting: Internal Medicine

## 2020-04-01 ENCOUNTER — Other Ambulatory Visit: Payer: Self-pay

## 2020-04-01 VITALS — BP 132/76 | HR 101 | Ht 65.0 in | Wt 140.0 lb

## 2020-04-01 DIAGNOSIS — M5412 Radiculopathy, cervical region: Secondary | ICD-10-CM

## 2020-04-01 NOTE — Progress Notes (Signed)
Date:  04/01/2020   Name:  Kristin Watkins   DOB:  02-03-1956   MRN:  [id number]   Chief Complaint: Neck Pain (Wants to see a new neurosurgeon because she said things are not resolving. )  Neck Pain  This is a chronic problem. The problem has been gradually worsening. The pain is present in the left side. The quality of the pain is described as burning. The pain is moderate. Associated symptoms include numbness (of the right middle finger pad) and weakness (of right hand intrinsics). Pertinent negatives include no chest pain, fever or headaches. Associated symptoms comments: Of right hand. She has tried NSAIDs, muscle relaxants and ice for the symptoms. The treatment provided no relief.    Lab Results  Component Value Date   CREATININE 0.74 01/10/2020   BUN 11 01/10/2020   NA 144 01/10/2020   K 4.3 01/10/2020   CL 107 (H) 01/10/2020   CO2 23 01/10/2020   Lab Results  Component Value Date   CHOL 240 (H) 01/10/2020   HDL 80 01/10/2020   LDLCALC 141 (H) 01/10/2020   TRIG 112 01/10/2020   CHOLHDL 3.0 01/10/2020   Lab Results  Component Value Date   TSH 0.844 01/10/2020   Lab Results  Component Value Date   HGBA1C 6.0 (H) 01/10/2020   Lab Results  Component Value Date   WBC 6.8 01/10/2020   HGB 14.4 01/10/2020   HCT 43.2 01/10/2020   MCV 90 01/10/2020   PLT 257 01/10/2020   Lab Results  Component Value Date   ALT 13 01/10/2020   AST 16 01/10/2020   ALKPHOS 54 01/10/2020   BILITOT 0.4 01/10/2020     Review of Systems  Constitutional: Negative for chills, fatigue and fever.  Respiratory: Negative for chest tightness.   Cardiovascular: Negative for chest pain.  Musculoskeletal: Positive for arthralgias, myalgias and neck pain.  Neurological: Positive for weakness (of right hand intrinsics) and numbness (of the right middle finger pad). Negative for dizziness and headaches.    Patient Active Problem List   Diagnosis Date Noted  . Hormone replacement therapy  (HRT) 03/28/2018  . Pre-diabetes 06/15/2017  . Mixed hyperlipidemia 06/15/2017  . Allergic rhinitis 10/15/2016  . Bursitis of hip 07/21/2015  . Degeneration of intervertebral disc of lumbar region 07/21/2015  . Dysphagia 07/21/2015  . Asthma, mild intermittent 07/21/2015  . Acid reflux 07/21/2015  . Personal history of other malignant neoplasm of skin 11/22/2012    No Known Allergies  Past Surgical History:  Procedure Laterality Date  . BACK SURGERY     spinal stimulator  . CERVICAL FUSION     C5-T1  . ESOPHAGOGASTRODUODENOSCOPY  2015   normal    Social History   Tobacco Use  . Smoking status: Never Smoker  . Smokeless tobacco: Never Used  Vaping Use  . Vaping Use: Never used  Substance Use Topics  . Alcohol use: Yes    Alcohol/week: 0.0 standard drinks  . Drug use: No     Medication list has been reviewed and updated.  Current Meds  Medication Sig  . albuterol (ACCUNEB) 0.63 MG/3ML nebulizer solution Take 1 ampule by nebulization every 6 (six) hours as needed for wheezing.  Marland Kitchen albuterol (PROAIR HFA) 108 (90 Base) MCG/ACT inhaler Inhale 2 puffs into the lungs every 6 (six) hours as needed for wheezing or shortness of breath.  . budesonide-formoterol (SYMBICORT) 160-4.5 MCG/ACT inhaler Inhale 2 puffs into the lungs 2 (two) times daily.  Marland Kitchen  CAMBIA 50 MG PACK TAKE 50 MG BY MOUTH DAILY.  . diazepam (VALIUM) 5 MG tablet Take 5 mg by mouth every 6 (six) hours as needed for anxiety.  . diclofenac Sodium (VOLTAREN) 1 % GEL Apply 4 g topically 4 (four) times daily.  Marland Kitchen ELESTRIN 0.52 MG/0.87 GM (0.06%) GEL APPLY TOPICALLY TO AFFECTED AREA EVERY DAY  . gabapentin (NEURONTIN) 300 MG capsule Take 300 mg by mouth daily.   . lansoprazole (PREVACID) 30 MG capsule TAKE 1 CAPSULE (30 MG TOTAL) BY MOUTH DAILY AT 12 NOON.  . meloxicam (MOBIC) 7.5 MG tablet Take 7.5 mg by mouth in the morning and at bedtime.  . methocarbamol (ROBAXIN) 500 MG tablet TAKE 1 2 TABLET BY MOUTH 3X A DAY AS  SNEEDED FOR MUSCLE PAIN  . montelukast (SINGULAIR) 10 MG tablet TAKE 1 TABLET BY MOUTH EVERY DAY  . progesterone (PROMETRIUM) 200 MG capsule TAKE ONE CAPSULE BY MOUTH DAILY  . Testosterone 1.62 % GEL Apply 5.68LE (3 clicks) transdermally daily.  Marland Kitchen tiZANidine (ZANAFLEX) 2 MG tablet Take 1 or 2 tabs by mouth TID prn  . traMADol-acetaminophen (ULTRACET) 37.5-325 MG tablet TAKE 1-2 TABLETS BY MOUTH TWICE DAILY AS NEEDED  . triamcinolone cream (KENALOG) 0.1 % Apply 1 application topically 2 (two) times daily.    PHQ 2/9 Scores 04/01/2020 01/10/2020 09/06/2018 06/15/2017  PHQ - 2 Score 0 - 0 0  PHQ- 9 Score 0 - - -  Exception Documentation - Medical reason - -    GAD 7 : Generalized Anxiety Score 04/01/2020  Nervous, Anxious, on Edge 1  Control/stop worrying 0  Worry too much - different things 0  Trouble relaxing 1  Restless 1  Easily annoyed or irritable 1  Afraid - awful might happen 0  Total GAD 7 Score 4  Anxiety Difficulty Not difficult at all    BP Readings from Last 3 Encounters:  04/01/20 132/76  01/10/20 132/80  10/07/18 138/88    Physical Exam Vitals and nursing note reviewed.  Constitutional:      General: She is not in acute distress.    Appearance: She is well-developed.  HENT:     Head: Normocephalic and atraumatic.  Pulmonary:     Effort: Pulmonary effort is normal. No respiratory distress.  Musculoskeletal:     Cervical back: Spasms present. Decreased range of motion.  Skin:    General: Skin is warm and dry.     Findings: No rash.  Neurological:     Mental Status: She is alert and oriented to person, place, and time.     Sensory: Sensory deficit (right middle finger pad numb) present.     Motor: Weakness (of right hand intrinsics) present. No tremor, atrophy or abnormal muscle tone.     Coordination: Coordination is intact.     Comments: Right grip 4/5  Psychiatric:        Behavior: Behavior normal.        Thought Content: Thought content normal.      Wt Readings from Last 3 Encounters:  04/01/20 140 lb (63.5 kg)  01/10/20 142 lb (64.4 kg)  10/07/18 152 lb (68.9 kg)    BP 132/76 (BP Location: Left Arm, Patient Position: Sitting, Cuff Size: Normal)   Pulse (!) 101   Ht 5\' 5"  (1.651 m)   Wt 140 lb (63.5 kg)   SpO2 98%   BMI 23.30 kg/m   Assessment and Plan: 1. Cervical radiculopathy at C6 S/p extensive fusion C5-T1 in the past Has  tried steroid taper, muscle relaxants, PTx, dry needling, heat/ice all with no benefit.  Weakness is significant and interfering with ADLs Await CT ordered by [facility] MD   Partially dictated using [employer]. Any errors are unintentional.  Halina Maidens, MD [facility]  04/01/2020

## 2020-04-08 ENCOUNTER — Other Ambulatory Visit: Payer: Self-pay

## 2020-04-08 ENCOUNTER — Ambulatory Visit
Admission: RE | Admit: 2020-04-08 | Discharge: 2020-04-08 | Disposition: A | Discharge status: Home / Self Care | Payer: BC Managed Care – PPO | Source: Ambulatory Visit | Attending: Physical Medicine and Rehabilitation | Admitting: Physical Medicine and Rehabilitation

## 2020-04-08 DIAGNOSIS — M5412 Radiculopathy, cervical region: Secondary | ICD-10-CM | POA: Diagnosis not present

## 2020-04-08 DIAGNOSIS — M47816 Spondylosis without myelopathy or radiculopathy, lumbar region: Secondary | ICD-10-CM | POA: Diagnosis not present

## 2020-04-08 DIAGNOSIS — M4312 Spondylolisthesis, cervical region: Secondary | ICD-10-CM | POA: Diagnosis not present

## 2020-04-15 ENCOUNTER — Telehealth: Payer: Self-pay

## 2020-04-15 DIAGNOSIS — M542 Cervicalgia: Secondary | ICD-10-CM | POA: Diagnosis not present

## 2020-04-15 DIAGNOSIS — R29898 Other symptoms and signs involving the musculoskeletal system: Secondary | ICD-10-CM | POA: Diagnosis not present

## 2020-04-15 NOTE — Telephone Encounter (Signed)
Called pt because we got a PA for Voltaren Gel that was filled 01/10/2020 with 2 RFs. Wanted to know if patient is still taking this medication? Does pt pay out of pocket? Does she need a RF?  KP

## 2020-04-17 DIAGNOSIS — L720 Epidermal cyst: Secondary | ICD-10-CM | POA: Diagnosis not present

## 2020-04-17 DIAGNOSIS — L821 Other seborrheic keratosis: Secondary | ICD-10-CM | POA: Diagnosis not present

## 2020-04-17 DIAGNOSIS — B351 Tinea unguium: Secondary | ICD-10-CM | POA: Diagnosis not present

## 2020-04-17 NOTE — Telephone Encounter (Signed)
Called pt left VM that the Voltaren Gel is not covered by her insurance that she would still need to buy it OTC or on [place] like she has been doing.  KP

## 2020-04-17 NOTE — Telephone Encounter (Signed)
Pt called back stated she still use this and would like a refill on the Voltaren Gel, she stated she has not gotten though her ins in a few year and not sure the cost.  She has been ordering this off of [facility] but [employer] was not covering it.  She would like to try to get this though her ins.   Best number [place] [facility]

## 2020-05-21 DIAGNOSIS — L720 Epidermal cyst: Secondary | ICD-10-CM | POA: Diagnosis not present

## 2020-05-22 DIAGNOSIS — M7062 Trochanteric bursitis, left hip: Secondary | ICD-10-CM | POA: Diagnosis not present

## 2020-05-22 DIAGNOSIS — M7061 Trochanteric bursitis, right hip: Secondary | ICD-10-CM | POA: Diagnosis not present

## 2020-05-30 DIAGNOSIS — G5601 Carpal tunnel syndrome, right upper limb: Secondary | ICD-10-CM | POA: Diagnosis not present

## 2020-06-03 DIAGNOSIS — G894 Chronic pain syndrome: Secondary | ICD-10-CM | POA: Diagnosis not present

## 2020-06-03 DIAGNOSIS — M5030 Other cervical disc degeneration, unspecified cervical region: Secondary | ICD-10-CM | POA: Diagnosis not present

## 2020-06-03 DIAGNOSIS — G609 Hereditary and idiopathic neuropathy, unspecified: Secondary | ICD-10-CM | POA: Diagnosis not present

## 2020-06-03 DIAGNOSIS — M5136 Other intervertebral disc degeneration, lumbar region: Secondary | ICD-10-CM | POA: Diagnosis not present

## 2020-08-08 DIAGNOSIS — G5621 Lesion of ulnar nerve, right upper limb: Secondary | ICD-10-CM | POA: Diagnosis not present

## 2020-08-23 ENCOUNTER — Other Ambulatory Visit: Payer: Self-pay | Admitting: Internal Medicine

## 2020-08-23 DIAGNOSIS — J309 Allergic rhinitis, unspecified: Secondary | ICD-10-CM

## 2020-08-23 NOTE — Telephone Encounter (Signed)
Requested medications are due for refill today yes  Requested medications are on the active medication list yes  Last refill 9/2  Last visit 08/2018  Future visit scheduled no  Notes to clinic Failed protocol due to no valid visit within 12  months.

## 2020-08-27 ENCOUNTER — Encounter: Payer: Self-pay | Admitting: Internal Medicine

## 2020-09-11 DIAGNOSIS — M7061 Trochanteric bursitis, right hip: Secondary | ICD-10-CM | POA: Diagnosis not present

## 2020-09-11 DIAGNOSIS — G5601 Carpal tunnel syndrome, right upper limb: Secondary | ICD-10-CM | POA: Diagnosis not present

## 2020-09-24 ENCOUNTER — Encounter: Payer: Self-pay | Admitting: Internal Medicine

## 2020-09-25 DIAGNOSIS — M7061 Trochanteric bursitis, right hip: Secondary | ICD-10-CM | POA: Diagnosis not present

## 2020-10-03 DIAGNOSIS — Z872 Personal history of diseases of the skin and subcutaneous tissue: Secondary | ICD-10-CM | POA: Diagnosis not present

## 2020-10-03 DIAGNOSIS — L578 Other skin changes due to chronic exposure to nonionizing radiation: Secondary | ICD-10-CM | POA: Diagnosis not present

## 2020-10-03 DIAGNOSIS — L570 Actinic keratosis: Secondary | ICD-10-CM | POA: Diagnosis not present

## 2020-10-03 DIAGNOSIS — Z85828 Personal history of other malignant neoplasm of skin: Secondary | ICD-10-CM | POA: Diagnosis not present

## 2020-10-03 DIAGNOSIS — Z86018 Personal history of other benign neoplasm: Secondary | ICD-10-CM | POA: Diagnosis not present

## 2020-10-10 DIAGNOSIS — M5136 Other intervertebral disc degeneration, lumbar region: Secondary | ICD-10-CM | POA: Diagnosis not present

## 2020-10-10 DIAGNOSIS — M5030 Other cervical disc degeneration, unspecified cervical region: Secondary | ICD-10-CM | POA: Diagnosis not present

## 2020-10-10 DIAGNOSIS — G5621 Lesion of ulnar nerve, right upper limb: Secondary | ICD-10-CM | POA: Diagnosis not present

## 2020-10-18 DIAGNOSIS — Z01818 Encounter for other preprocedural examination: Secondary | ICD-10-CM | POA: Diagnosis not present

## 2020-10-18 DIAGNOSIS — M7061 Trochanteric bursitis, right hip: Secondary | ICD-10-CM | POA: Diagnosis not present

## 2020-10-22 HISTORY — PX: EXCISION/RELEASE BURSA HIP: SHX5014

## 2020-10-31 DIAGNOSIS — M7631 Iliotibial band syndrome, right leg: Secondary | ICD-10-CM | POA: Diagnosis not present

## 2020-10-31 DIAGNOSIS — M7061 Trochanteric bursitis, right hip: Secondary | ICD-10-CM | POA: Diagnosis not present

## 2020-10-31 DIAGNOSIS — Z9682 Presence of neurostimulator: Secondary | ICD-10-CM | POA: Diagnosis not present

## 2020-10-31 DIAGNOSIS — Z91048 Other nonmedicinal substance allergy status: Secondary | ICD-10-CM | POA: Diagnosis not present

## 2020-10-31 DIAGNOSIS — M24651 Ankylosis, right hip: Secondary | ICD-10-CM | POA: Diagnosis not present

## 2020-10-31 DIAGNOSIS — Z7951 Long term (current) use of inhaled steroids: Secondary | ICD-10-CM | POA: Diagnosis not present

## 2020-10-31 DIAGNOSIS — Z981 Arthrodesis status: Secondary | ICD-10-CM | POA: Diagnosis not present

## 2020-10-31 DIAGNOSIS — Z791 Long term (current) use of non-steroidal anti-inflammatories (NSAID): Secondary | ICD-10-CM | POA: Diagnosis not present

## 2020-10-31 DIAGNOSIS — J45909 Unspecified asthma, uncomplicated: Secondary | ICD-10-CM | POA: Diagnosis not present

## 2020-11-11 ENCOUNTER — Other Ambulatory Visit: Payer: Self-pay | Admitting: Internal Medicine

## 2020-11-11 DIAGNOSIS — J309 Allergic rhinitis, unspecified: Secondary | ICD-10-CM

## 2020-11-19 HISTORY — PX: ELBOW SURGERY: SHX618

## 2020-11-22 DIAGNOSIS — G5621 Lesion of ulnar nerve, right upper limb: Secondary | ICD-10-CM | POA: Diagnosis not present

## 2020-11-22 DIAGNOSIS — G8918 Other acute postprocedural pain: Secondary | ICD-10-CM | POA: Diagnosis not present

## 2020-12-04 DIAGNOSIS — M25521 Pain in right elbow: Secondary | ICD-10-CM | POA: Diagnosis not present

## 2021-02-08 ENCOUNTER — Other Ambulatory Visit: Payer: Self-pay | Admitting: Internal Medicine

## 2021-02-08 DIAGNOSIS — J309 Allergic rhinitis, unspecified: Secondary | ICD-10-CM

## 2021-02-24 ENCOUNTER — Ambulatory Visit: Payer: Self-pay

## 2021-02-24 NOTE — Telephone Encounter (Signed)
Pt. Pulled 2 deer ticks off 2 months ago. Is now having joint pain and fatigue. Concerned about Lyme's Disease. No availability this week. Wants to be worked in if possible or have lab work for tick borne disease. Also wants to know if there is lab for arthritis. Please advise pt.  Answer Assessment - Initial Assessment Questions 1. TYPE of INSECT: "What type of insect was it?"      Tick 2. ONSET: "When did you get bitten?"      2 months ago 3. LOCATION: "Where is the insect bite located?"      Back and left leg 4. REDNESS: "Is the area red or pink?" If Yes, ask: "What size is area of redness?" (inches or cm). "When did the redness start?"     Red 5. PAIN: "Is there any pain?" If Yes, ask: "How bad is it?"  (Scale 1-10; or mild, moderate, severe)     No 6. ITCHING: "Does it itch?" If Yes, ask: "How bad is the itch?"    - MILD: doesn't interfere with normal activities   - MODERATE-SEVERE: interferes with work, school, sleep, or other activities      No 7. SWELLING: "How big is the swelling?" (inches, cm, or compare to coins)     No 8. OTHER SYMPTOMS: "Do you have any other symptoms?"  (e.g., difficulty breathing, hives)     Fatigue and joint pain 9. PREGNANCY: "Is there any chance you are pregnant?" "When was your last menstrual period?"     No  Protocols used: INSECT BITE-A-AH

## 2021-02-26 ENCOUNTER — Encounter: Payer: Self-pay | Admitting: Internal Medicine

## 2021-02-26 ENCOUNTER — Other Ambulatory Visit: Payer: Self-pay

## 2021-02-26 ENCOUNTER — Ambulatory Visit (INDEPENDENT_AMBULATORY_CARE_PROVIDER_SITE_OTHER): Payer: Federal, State, Local not specified - PPO | Admitting: Internal Medicine

## 2021-02-26 VITALS — BP 136/84 | HR 85 | Temp 97.9°F | Ht 65.0 in | Wt 134.0 lb

## 2021-02-26 DIAGNOSIS — R5383 Other fatigue: Secondary | ICD-10-CM | POA: Diagnosis not present

## 2021-02-26 DIAGNOSIS — M2550 Pain in unspecified joint: Secondary | ICD-10-CM | POA: Diagnosis not present

## 2021-02-26 DIAGNOSIS — S30861A Insect bite (nonvenomous) of abdominal wall, initial encounter: Secondary | ICD-10-CM | POA: Diagnosis not present

## 2021-02-26 DIAGNOSIS — J4520 Mild intermittent asthma, uncomplicated: Secondary | ICD-10-CM | POA: Diagnosis not present

## 2021-02-26 DIAGNOSIS — W57XXXA Bitten or stung by nonvenomous insect and other nonvenomous arthropods, initial encounter: Secondary | ICD-10-CM

## 2021-02-26 MED ORDER — BUDESONIDE-FORMOTEROL FUMARATE 160-4.5 MCG/ACT IN AERO: 2.0000 | INHALATION_SPRAY | Freq: Two times a day (BID) | RESPIRATORY_TRACT | 5 refills | Status: DC

## 2021-02-26 NOTE — Progress Notes (Signed)
Date:  02/26/2021   Name:  Kristin Watkins   DOB:  1956/04/22   MRN:  [id number]   Chief Complaint: Joint Pain (X1-2 months, fatigue, happened the same time,shoulders hands and knees in the last few weeks, in the woods everyday pulls ticks off of her wants blood work to rule out lyme disease )  HPI Joint pain - onset several weeks ago of symmetrical joint pain and stiffness in both shoulders, fingers of both hands and both knees.  No redness, swelling, heat.  Morning stiffness that last about 30 minutes.  No relief from Aleve bid or Mobic. Concerned because she has had multiple tick bites this year.  Fatigue - more fatigue lately.  She did have hip surgery in Feb and elbow surgery in March.  She was less active for a while and also stopped several of her supplements and multi-vitamins briefly.  Asthma - mild asthma is intermittent, improved over the past 2 years due to wearing a mask.  She has used symbicort several years ago and derived benefit.  She would like a refill.   Lab Results  Component Value Date   CREATININE 0.74 01/10/2020   BUN 11 01/10/2020   NA 144 01/10/2020   K 4.3 01/10/2020   CL 107 (H) 01/10/2020   CO2 23 01/10/2020   Lab Results  Component Value Date   CHOL 240 (H) 01/10/2020   HDL 80 01/10/2020   LDLCALC 141 (H) 01/10/2020   TRIG 112 01/10/2020   CHOLHDL 3.0 01/10/2020   Lab Results  Component Value Date   TSH 0.844 01/10/2020   Lab Results  Component Value Date   HGBA1C 6.0 (H) 01/10/2020   Lab Results  Component Value Date   WBC 6.8 01/10/2020   HGB 14.4 01/10/2020   HCT 43.2 01/10/2020   MCV 90 01/10/2020   PLT 257 01/10/2020   Lab Results  Component Value Date   ALT 13 01/10/2020   AST 16 01/10/2020   ALKPHOS 54 01/10/2020   BILITOT 0.4 01/10/2020     Review of Systems  Constitutional: Positive for fatigue. Negative for chills, fever and unexpected weight change.  Respiratory: Positive for shortness of breath and wheezing.  Negative for cough and chest tightness.   Cardiovascular: Negative for chest pain and palpitations.  Musculoskeletal: Positive for arthralgias and back pain. Negative for joint swelling and myalgias.  Neurological: Positive for dizziness. Negative for light-headedness, numbness and headaches.    Patient Active Problem List   Diagnosis Date Noted  . Hormone replacement therapy (HRT) 03/28/2018  . Pre-diabetes 06/15/2017  . Mixed hyperlipidemia 06/15/2017  . Allergic rhinitis 10/15/2016  . Bursitis of hip 07/21/2015  . Degeneration of intervertebral disc of lumbar region 07/21/2015  . Dysphagia 07/21/2015  . Asthma, mild intermittent 07/21/2015  . Acid reflux 07/21/2015  . Personal history of other malignant neoplasm of skin 11/22/2012    No Known Allergies  Past Surgical History:  Procedure Laterality Date  . BACK SURGERY     spinal stimulator  . CERVICAL FUSION     C5-T1  . ESOPHAGOGASTRODUODENOSCOPY  2015   normal    Social History   Tobacco Use  . Smoking status: Never Smoker  . Smokeless tobacco: Never Used  Vaping Use  . Vaping Use: Never used  Substance Use Topics  . Alcohol use: Yes    Alcohol/week: 0.0 standard drinks  . Drug use: No     Medication list has been reviewed and updated.  Current Meds  Medication Sig  . albuterol (ACCUNEB) 0.63 MG/3ML nebulizer solution Take 1 ampule by nebulization every 6 (six) hours as needed for wheezing.  Marland Kitchen albuterol (PROAIR HFA) 108 (90 Base) MCG/ACT inhaler Inhale 2 puffs into the lungs every 6 (six) hours as needed for wheezing or shortness of breath.  . chlorhexidine (PERIDEX) 0.12 % solution SMARTSIG:By Mouth  . diazepam (VALIUM) 5 MG tablet Take 5 mg by mouth every 6 (six) hours as needed for anxiety.  . diclofenac Sodium (VOLTAREN) 1 % GEL Apply 4 g topically 4 (four) times daily.  Marland Kitchen gabapentin (NEURONTIN) 300 MG capsule Take 300 mg by mouth daily. 300-600  . lansoprazole (PREVACID) 30 MG capsule TAKE 1 CAPSULE  (30 MG TOTAL) BY MOUTH DAILY AT 12 NOON.  . meloxicam (MOBIC) 7.5 MG tablet Take 7.5 mg by mouth in the morning and at bedtime.  . montelukast (SINGULAIR) 10 MG tablet TAKE 1 TABLET BY MOUTH EVERY DAY  . ondansetron (ZOFRAN-ODT) 4 MG disintegrating tablet Take 4 mg by mouth every 6 (six) hours as needed.  Marland Kitchen tiZANidine (ZANAFLEX) 2 MG tablet Take 1 or 2 tabs by mouth TID prn  . triamcinolone cream (KENALOG) 0.1 % Apply 1 application topically 2 (two) times daily.  . [DISCONTINUED] docusate sodium (COLACE) 100 MG capsule Take 100 mg by mouth 2 (two) times daily as needed.  . [DISCONTINUED] metFORMIN (GLUCOPHAGE-XR) 500 MG 24 hr tablet daily.    PHQ 2/9 Scores 04/01/2020 01/10/2020 09/06/2018 06/15/2017  PHQ - 2 Score 0 - 0 0  PHQ- 9 Score 0 - - -  Exception Documentation - Medical reason - -    GAD 7 : Generalized Anxiety Score 04/01/2020  Nervous, Anxious, on Edge 1  Control/stop worrying 0  Worry too much - different things 0  Trouble relaxing 1  Restless 1  Easily annoyed or irritable 1  Afraid - awful might happen 0  Total GAD 7 Score 4  Anxiety Difficulty Not difficult at all    BP Readings from Last 3 Encounters:  02/26/21 136/84  04/01/20 132/76  01/10/20 132/80    Physical Exam Vitals and nursing note reviewed.  Constitutional:      General: She is not in acute distress.    Appearance: Normal appearance. She is well-developed.  HENT:     Head: Normocephalic and atraumatic.  Cardiovascular:     Rate and Rhythm: Normal rate and regular rhythm.     Pulses: Normal pulses.     Heart sounds: No murmur heard.   Pulmonary:     Effort: Pulmonary effort is normal. No respiratory distress.     Breath sounds: No wheezing or rhonchi.  Musculoskeletal:     Cervical back: Normal range of motion.     Right lower leg: No edema.     Left lower leg: No edema.     Comments: No edema, warmth, redness, crepitus or deformity of hands/fingers/knees/shoulders.  Lymphadenopathy:      Cervical: No cervical adenopathy.  Skin:    General: Skin is warm and dry.     Findings: No rash.  Neurological:     Mental Status: She is alert and oriented to person, place, and time.  Psychiatric:        Mood and Affect: Mood normal.        Behavior: Behavior normal.     Wt Readings from Last 3 Encounters:  02/26/21 134 lb (60.8 kg)  04/01/20 140 lb (63.5 kg)  01/10/20 142 lb (64.4  kg)    BP 136/84   Pulse 85   Temp 97.9 F (36.6 C) (Oral)   Ht 5\' 5"  (1.651 m)   Wt 134 lb (60.8 kg)   SpO2 97%   BMI 22.30 kg/m   Assessment and Plan: 1. Arthralgia, unspecified joint Bilateral/symetrical joint complaints of acute onset no relieved by nsaids - Rheumatoid factor  2. Tick bite of abdominal wall, initial encounter Tick bite site healed without target lesion. Recent antibiotics for dental infection etc but no doxycycline. - Lyme Ab/Western Blot Reflex  3. Mild intermittent asthma without complication Continue Symbicort - budesonide-formoterol (SYMBICORT) 160-4.5 MCG/ACT inhaler; Inhale 2 puffs into the lungs 2 (two) times daily.  Dispense: 1 each; Refill: 5  4. Fatigue, unspecified type Will obtain vitamin D to advise if dosing is needed - VITAMIN D 25 Hydroxy (Vit-D Deficiency, Fractures)   Partially dictated using Editor, commissioning. Any errors are unintentional.  Halina Maidens, MD [facility]  02/26/2021

## 2021-02-27 LAB — VITAMIN D 25 HYDROXY (VIT D DEFICIENCY, FRACTURES): Vit D, 25-Hydroxy: 31.6 ng/mL (ref 30.0–100.0)

## 2021-02-27 LAB — RHEUMATOID FACTOR: Rheumatoid fact SerPl-aCnc: <10 IU/mL (ref <14.0)

## 2021-02-27 LAB — LYME DISEASE SEROLOGY W/REFLEX: Lyme Total Antibody EIA: NEGATIVE

## 2021-03-10 ENCOUNTER — Ambulatory Visit: Payer: Federal, State, Local not specified - PPO | Admitting: Internal Medicine

## 2021-03-19 DIAGNOSIS — M25562 Pain in left knee: Secondary | ICD-10-CM | POA: Diagnosis not present

## 2021-04-16 ENCOUNTER — Other Ambulatory Visit: Payer: Self-pay

## 2021-04-16 ENCOUNTER — Ambulatory Visit (INDEPENDENT_AMBULATORY_CARE_PROVIDER_SITE_OTHER): Payer: Federal, State, Local not specified - PPO | Admitting: Internal Medicine

## 2021-04-16 ENCOUNTER — Encounter: Payer: Self-pay | Admitting: Internal Medicine

## 2021-04-16 VITALS — BP 138/80 | HR 95 | Temp 96.5°F | Resp 15 | Ht 65.0 in | Wt 132.4 lb

## 2021-04-16 DIAGNOSIS — M5136 Other intervertebral disc degeneration, lumbar region: Secondary | ICD-10-CM

## 2021-04-16 DIAGNOSIS — R7303 Prediabetes: Secondary | ICD-10-CM

## 2021-04-16 DIAGNOSIS — M5412 Radiculopathy, cervical region: Secondary | ICD-10-CM

## 2021-04-16 DIAGNOSIS — Z1231 Encounter for screening mammogram for malignant neoplasm of breast: Secondary | ICD-10-CM

## 2021-04-16 DIAGNOSIS — E782 Mixed hyperlipidemia: Secondary | ICD-10-CM

## 2021-04-16 DIAGNOSIS — J4520 Mild intermittent asthma, uncomplicated: Secondary | ICD-10-CM

## 2021-04-16 DIAGNOSIS — M130 Polyarthritis, unspecified: Secondary | ICD-10-CM

## 2021-04-16 MED ORDER — TRAMADOL HCL 50 MG PO TABS: 50.0000 mg | ORAL_TABLET | Freq: Four times a day (QID) | ORAL | 0 refills | Status: AC | PRN

## 2021-04-16 NOTE — Progress Notes (Signed)
Subjective:  Patient ID: Kristin Watkins, female    DOB: 07-Jul-1956  Age: 65 y.o. MRN: [id number]  CC: The primary encounter diagnosis was Prediabetes. Diagnoses of Cervical radiculopathy, Mixed hyperlipidemia, Polyarthritis, Encounter for screening mammogram for malignant neoplasm of breast, Degeneration of intervertebral disc of lumbar region, Mild intermittent asthma without complication, and Pre-diabetes were also pertinent to this visit.  HPI Kristin Watkins presents for establishment /transfer of care from [facility].  Referred by wife Shirlean Mylar   This visit occurred during the SARS-CoV-2 public health emergency.  Safety protocols were in place, including screening questions prior to the visit, additional usage of staff PPE, and extensive cleaning of exam room while observing appropriate contact time as indicated for disinfecting solutions.   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  Her spinal condition is  currently managed by physiatrist  at [facility]. Her chronic  back pain is managed with gabapentin,  300 to 600 mg at night , as well as an implanted  spinal cord stimulator that she can manage I, which was done in 2010  She has left leg sciatica intermittently.  She does not travel for work,  and remains physically very active in spite of her spinal condition. She has had  a reduction in quality of sleep as she is woken up every 2 to 4 hours due to back pain.     She has also been having joint pain the PIPs of both hands.  Recent rheumatoid factor and Lymes Disease screen were negative.   She has  a history of asthma and uses a maintenance inhaler, with no recent use of albuterol   History Kristin Watkins has a past medical history of Allergy, Asthma, Blood transfusion without reported diagnosis, Cancer ([place]), Chronic bronchitis ([place]), GERD  (gastroesophageal reflux disease), and Migraines.   She has a past surgical history that includes Back surgery; Esophagogastroduodenoscopy (2015); Cervical fusion; Elbow surgery (Right, 11/2020); and Excision/release bursa hip (Right, 10/2020).   Her family history includes Diabetes in her father; Renal cancer in her father.She reports that she has never smoked. She has never used smokeless tobacco. She reports current alcohol use. She reports that she does not use drugs.  Outpatient Medications Prior to Visit  Medication Sig Dispense Refill   albuterol (ACCUNEB) 0.63 MG/3ML nebulizer solution Take 1 ampule by nebulization every 6 (six) hours as needed for wheezing.     albuterol (PROAIR HFA) 108 (90 Base) MCG/ACT inhaler Inhale 2 puffs into the lungs every 6 (six) hours as needed for wheezing or shortness of breath. 8.5 Inhaler 12   budesonide-formoterol (SYMBICORT) 160-4.5 MCG/ACT inhaler Inhale 2 puffs into the lungs 2 (two) times daily. 1 each 5   [place] 50 MG PACK TAKE 50 MG BY MOUTH DAILY. 27 each 2   chlorhexidine (PERIDEX) 0.12 % solution SMARTSIG:By Mouth     diclofenac Sodium (VOLTAREN) 1 % GEL Apply 4 g topically 4 (four) times daily. 150 g 2   gabapentin (NEURONTIN) 300 MG capsule Take 300 mg by mouth daily. 300-600     meloxicam (MOBIC) 7.5 MG tablet Take 7.5 mg by mouth in the morning and at bedtime.     montelukast (SINGULAIR) 10 MG tablet TAKE 1 TABLET BY MOUTH EVERY DAY 90 tablet 0   ondansetron (ZOFRAN-ODT) 4 MG disintegrating tablet Take 4 mg by  mouth every 6 (six) hours as needed.     tiZANidine (ZANAFLEX) 2 MG tablet Take 1 or 2 tabs by mouth TID prn     triamcinolone cream (KENALOG) 0.1 % Apply 1 application topically 2 (two) times daily. 80 g 0   lansoprazole (PREVACID) 30 MG capsule TAKE 1 CAPSULE (30 MG TOTAL) BY MOUTH DAILY AT 12 NOON. (Patient not taking: Reported on 04/16/2021) 90 capsule 1   diazepam (VALIUM) 5 MG tablet Take 5 mg by mouth every 6 (six) hours as  needed for anxiety. (Patient not taking: Reported on 04/16/2021)     No facility-administered medications prior to visit.    Review of Systems:  Patient denies headache, fevers, malaise, unintentional weight loss, skin rash, eye pain, sinus congestion and sinus pain, sore throat, dysphagia,  hemoptysis , cough, dyspnea, wheezing, chest pain, palpitations, orthopnea, edema, abdominal pain, nausea, melena, diarrhea, constipation, flank pain, dysuria, hematuria, urinary  Frequency, nocturia, numbness, tingling, seizures,  Focal weakness, Loss of consciousness,  Tremor, insomnia, depression, anxiety, and suicidal ideation.     Objective:  BP 138/80 (BP Location: Left Arm, Patient Position: Sitting, Cuff Size: Normal)   Pulse 95   Temp (!) 96.5 F (35.8 C) (Temporal)   Resp 15   Ht '5\' 5"'$  (1.651 m)   Wt 132 lb 6.4 oz (60.1 kg)   SpO2 97%   BMI 22.03 kg/m   Physical Exam:   General appearance: alert, cooperative and appears stated age Ears: normal TM's and external ear canals both ears Throat: lips, mucosa, and tongue normal; teeth and gums normal Neck: no adenopathy, no carotid bruit, supple, symmetrical, trachea midline and thyroid not enlarged, symmetric, no tenderness/mass/nodules Back: symmetric, no curvature. ROM normal. No CVA tenderness. Lungs: clear to auscultation bilaterally Heart: regular rate and rhythm, S1, S2 normal, no murmur, click, rub or gallop Abdomen: soft, non-tender; bowel sounds normal; no masses,  no organomegaly Pulses: 2+ and symmetric Skin: Skin color, texture, turgor normal. No rashes or lesions Lymph nodes: Cervical, supraclavicular, and axillary nodes normal.   Assessment & Plan:   Problem List Items Addressed This Visit       Unprioritized   Degeneration of intervertebral disc of lumbar region    Offered tramadol 50 mg for use at bedtime to Improve her quality of sleep that has been compromised by back pain and inadequately treated with gabapentin,  meloxicam, tizanidine and tylenol       Relevant Medications   traMADol (ULTRAM) 50 MG tablet   Asthma, mild intermittent    No recent use of albuterol        Pre-diabetes    Reviewed her recent labs.  A1c has risen slightly    Lab Results  Component Value Date   HGBA1C 6.2 04/16/2021         Mixed hyperlipidemia    Her ten year risk  Of ASCVD is low, due to an excellent HDL:LDL ratio no treatment advised.   Lab Results  Component Value Date   CHOL 240 (H) 01/10/2020   HDL 80 01/10/2020   LDLCALC 141 (H) 01/10/2020   TRIG 112 01/10/2020   CHOLHDL 3.0 01/10/2020        Cervical radiculopathy   Polyarthritis    Primarily affecting the PIPs in her hands..  She has no synovitis on exam and no evidence of rhaumatic disease.   Lab Results  Component Value Date   ESRSEDRATE 6 04/16/2021   Lab Results  Component Value Date   CRP <  1.0 04/16/2021         Relevant Orders   Sedimentation rate (Completed)   C-reactive protein (Completed)   Cyclic citrul peptide antibody, IgG (Completed)   Other Visit Diagnoses     Prediabetes    -  Primary   Relevant Orders   Comprehensive metabolic panel (Completed)   Hemoglobin A1c (Completed)   Encounter for screening mammogram for malignant neoplasm of breast       Relevant Orders   MM 3D SCREEN BREAST BILATERAL       I have discontinued [place]. [place]'s diazepam. I am also having her start on traMADol. Additionally, I am having her maintain her albuterol, lansoprazole, albuterol, gabapentin, triamcinolone cream, [place], meloxicam, diclofenac Sodium, tiZANidine, montelukast, chlorhexidine, ondansetron, and budesonide-formoterol.  Meds ordered this encounter  Medications   traMADol (ULTRAM) 50 MG tablet    Sig: Take 1 tablet (50 mg total) by mouth every 6 (six) hours as needed for up to 7 days.    Dispense:  28 tablet    Refill:  0    Medications Discontinued During This Encounter  Medication Reason   diazepam  (VALIUM) 5 MG tablet     Follow-up: Return in about 6 months (around 10/17/2021).   Crecencio Mc, MD

## 2021-04-16 NOTE — Patient Instructions (Addendum)
Limit vitamin D to 2000 Ius daily  Renal function needs to be checked today before we start the celebrex  Limit tylenol to 2000 mg daily   Trial of  tramadol at bedtime to help you rest    The new "normal"  for  blood pressure is now 120/70, .  Please check your blood pressure a few times at home and send me the readings so I can determine if you need to start a medication to lower your blood pressure .

## 2021-04-17 DIAGNOSIS — M130 Polyarthritis, unspecified: Secondary | ICD-10-CM | POA: Insufficient documentation

## 2021-04-17 LAB — COMPREHENSIVE METABOLIC PANEL WITH GFR
ALT: 14 U/L (ref 0–35)
AST: 14 U/L (ref 0–37)
Albumin: 4.4 g/dL (ref 3.5–5.2)
Alkaline Phosphatase: 54 U/L (ref 39–117)
BUN: 20 mg/dL (ref 6–23)
CO2: 26 meq/L (ref 19–32)
Calcium: 9.8 mg/dL (ref 8.4–10.5)
Chloride: 104 meq/L (ref 96–112)
Creatinine, Ser: 0.87 mg/dL (ref 0.40–1.20)
GFR: 70.74 mL/min
Glucose, Bld: 94 mg/dL (ref 70–99)
Potassium: 4 meq/L (ref 3.5–5.1)
Sodium: 140 meq/L (ref 135–145)
Total Bilirubin: 0.4 mg/dL (ref 0.2–1.2)
Total Protein: 6.7 g/dL (ref 6.0–8.3)

## 2021-04-17 LAB — CYCLIC CITRUL PEPTIDE ANTIBODY, IGG: Cyclic Citrullin Peptide Ab: <16 UNITS

## 2021-04-17 LAB — C-REACTIVE PROTEIN: CRP: <1 mg/dL (ref 0.5–20.0)

## 2021-04-17 LAB — SEDIMENTATION RATE: Sed Rate: 6 mm/h (ref 0–30)

## 2021-04-17 LAB — HEMOGLOBIN A1C: Hgb A1c MFr Bld: 6.2 % (ref 4.6–6.5)

## 2021-04-17 NOTE — Assessment & Plan Note (Signed)
Reviewed her recent labs.  A1c has risen slightly    Lab Results  Component Value Date   HGBA1C 6.2 04/16/2021

## 2021-04-17 NOTE — Assessment & Plan Note (Addendum)
Offered tramadol 50 mg for use at bedtime to Improve her quality of sleep that has been compromised by back pain and inadequately treated with gabapentin, meloxicam, tizanidine and tylenol

## 2021-04-17 NOTE — Assessment & Plan Note (Signed)
No recent use of albuterol

## 2021-04-17 NOTE — Assessment & Plan Note (Signed)
Primarily affecting the PIPs in her hands..  She has no synovitis on exam and no evidence of rhaumatic disease.   Lab Results  Component Value Date   ESRSEDRATE 6 04/16/2021   Lab Results  Component Value Date   CRP <1.0 04/16/2021

## 2021-04-17 NOTE — Assessment & Plan Note (Signed)
Her ten year risk  Of ASCVD is low, due to an excellent HDL:LDL ratio no treatment advised.   Lab Results  Component Value Date   CHOL 240 (H) 01/10/2020   HDL 80 01/10/2020   LDLCALC 141 (H) 01/10/2020   TRIG 112 01/10/2020   CHOLHDL 3.0 01/10/2020

## 2021-05-06 ENCOUNTER — Inpatient Hospital Stay: Admission: RE | Admit: 2021-05-06 | Source: Ambulatory Visit

## 2021-05-09 ENCOUNTER — Other Ambulatory Visit: Payer: Self-pay | Admitting: Internal Medicine

## 2021-05-09 DIAGNOSIS — J309 Allergic rhinitis, unspecified: Secondary | ICD-10-CM

## 2021-05-12 ENCOUNTER — Other Ambulatory Visit: Payer: Self-pay

## 2021-05-12 ENCOUNTER — Ambulatory Visit
Admission: RE | Admit: 2021-05-12 | Discharge: 2021-05-12 | Disposition: A | Discharge status: Home / Self Care | Payer: Federal, State, Local not specified - PPO | Source: Ambulatory Visit | Attending: Internal Medicine | Admitting: Internal Medicine

## 2021-05-12 DIAGNOSIS — J309 Allergic rhinitis, unspecified: Secondary | ICD-10-CM

## 2021-05-12 DIAGNOSIS — Z1231 Encounter for screening mammogram for malignant neoplasm of breast: Secondary | ICD-10-CM | POA: Diagnosis not present

## 2021-05-12 NOTE — Telephone Encounter (Signed)
Last refilled by previous PCP.  Last OV: 04/16/2021 Next OV: 10/17/2021  Is it okay to refill?

## 2021-05-13 ENCOUNTER — Other Ambulatory Visit: Payer: Self-pay | Admitting: Internal Medicine

## 2021-05-13 DIAGNOSIS — N6310 Unspecified lump in the right breast, unspecified quadrant: Secondary | ICD-10-CM

## 2021-05-13 DIAGNOSIS — M25511 Pain in right shoulder: Secondary | ICD-10-CM | POA: Diagnosis not present

## 2021-05-13 DIAGNOSIS — R928 Other abnormal and inconclusive findings on diagnostic imaging of breast: Secondary | ICD-10-CM

## 2021-05-13 DIAGNOSIS — M13811 Other specified arthritis, right shoulder: Secondary | ICD-10-CM | POA: Diagnosis not present

## 2021-05-13 NOTE — Telephone Encounter (Signed)
Already refilled for one year

## 2021-05-15 ENCOUNTER — Other Ambulatory Visit: Payer: Self-pay | Admitting: Internal Medicine

## 2021-05-15 DIAGNOSIS — J309 Allergic rhinitis, unspecified: Secondary | ICD-10-CM

## 2021-05-16 ENCOUNTER — Other Ambulatory Visit: Payer: Self-pay | Admitting: Internal Medicine

## 2021-05-16 DIAGNOSIS — J309 Allergic rhinitis, unspecified: Secondary | ICD-10-CM

## 2021-05-19 MED ORDER — MONTELUKAST SODIUM 10 MG PO TABS: 10.0000 mg | ORAL_TABLET | Freq: Every day | ORAL | 2 refills | Status: DC

## 2021-05-20 ENCOUNTER — Ambulatory Visit
Admission: RE | Admit: 2021-05-20 | Discharge: 2021-05-20 | Disposition: A | Discharge status: Home / Self Care | Payer: Federal, State, Local not specified - PPO | Source: Ambulatory Visit | Attending: Internal Medicine | Admitting: Internal Medicine

## 2021-05-20 ENCOUNTER — Other Ambulatory Visit: Payer: Self-pay

## 2021-05-20 DIAGNOSIS — N6310 Unspecified lump in the right breast, unspecified quadrant: Secondary | ICD-10-CM | POA: Insufficient documentation

## 2021-05-20 DIAGNOSIS — R928 Other abnormal and inconclusive findings on diagnostic imaging of breast: Secondary | ICD-10-CM

## 2021-05-20 DIAGNOSIS — R922 Inconclusive mammogram: Secondary | ICD-10-CM | POA: Diagnosis not present

## 2021-06-06 DIAGNOSIS — M7541 Impingement syndrome of right shoulder: Secondary | ICD-10-CM | POA: Diagnosis not present

## 2021-06-30 ENCOUNTER — Other Ambulatory Visit: Payer: Self-pay | Admitting: Orthopedic Surgery

## 2021-06-30 DIAGNOSIS — M25511 Pain in right shoulder: Secondary | ICD-10-CM

## 2021-07-07 DIAGNOSIS — Z23 Encounter for immunization: Secondary | ICD-10-CM | POA: Diagnosis not present

## 2021-07-16 DIAGNOSIS — M25511 Pain in right shoulder: Secondary | ICD-10-CM | POA: Diagnosis not present

## 2021-07-22 ENCOUNTER — Ambulatory Visit
Admission: RE | Admit: 2021-07-22 | Discharge: 2021-07-22 | Disposition: A | Discharge status: Home / Self Care | Payer: Federal, State, Local not specified - PPO | Source: Ambulatory Visit | Attending: Orthopedic Surgery | Admitting: Orthopedic Surgery

## 2021-07-22 ENCOUNTER — Other Ambulatory Visit: Payer: Self-pay

## 2021-07-22 DIAGNOSIS — M19011 Primary osteoarthritis, right shoulder: Secondary | ICD-10-CM | POA: Diagnosis not present

## 2021-07-22 DIAGNOSIS — M25511 Pain in right shoulder: Secondary | ICD-10-CM

## 2021-07-22 DIAGNOSIS — M12811 Other specific arthropathies, not elsewhere classified, right shoulder: Secondary | ICD-10-CM | POA: Insufficient documentation

## 2021-07-22 DIAGNOSIS — M75121 Complete rotator cuff tear or rupture of right shoulder, not specified as traumatic: Secondary | ICD-10-CM | POA: Insufficient documentation

## 2021-07-22 DIAGNOSIS — S43001A Unspecified subluxation of right shoulder joint, initial encounter: Secondary | ICD-10-CM | POA: Diagnosis not present

## 2021-07-22 MED ORDER — SODIUM CHLORIDE (PF) 0.9 % IJ SOLN
10.0000 mL | INTRAMUSCULAR | Status: DC | PRN
  Administered 2021-07-22: 10 mL

## 2021-07-22 MED ORDER — LIDOCAINE HCL (PF) 1 % IJ SOLN
5.0000 mL | Freq: Once | INTRAMUSCULAR | Status: AC
  Administered 2021-07-22: 5 mL via INTRADERMAL
  Filled 2021-07-22: qty 5

## 2021-07-22 MED ORDER — IOHEXOL 180 MG/ML  SOLN
20.0000 mL | Freq: Once | INTRAMUSCULAR | Status: AC | PRN
  Administered 2021-07-22: 20 mL

## 2021-07-25 DIAGNOSIS — Z23 Encounter for immunization: Secondary | ICD-10-CM | POA: Diagnosis not present

## 2021-08-11 ENCOUNTER — Telehealth (INDEPENDENT_AMBULATORY_CARE_PROVIDER_SITE_OTHER): Payer: Federal, State, Local not specified - PPO | Admitting: Adult Health

## 2021-08-11 ENCOUNTER — Other Ambulatory Visit
Admission: RE | Admit: 2021-08-11 | Discharge: 2021-08-11 | Disposition: A | Discharge status: Home / Self Care | Source: Ambulatory Visit | Attending: Orthopedic Surgery | Admitting: Orthopedic Surgery

## 2021-08-11 ENCOUNTER — Other Ambulatory Visit: Payer: Self-pay

## 2021-08-11 ENCOUNTER — Encounter: Payer: Self-pay | Admitting: Adult Health

## 2021-08-11 VITALS — Ht 64.02 in | Wt 127.0 lb

## 2021-08-11 DIAGNOSIS — J019 Acute sinusitis, unspecified: Secondary | ICD-10-CM | POA: Diagnosis not present

## 2021-08-11 DIAGNOSIS — Z87898 Personal history of other specified conditions: Secondary | ICD-10-CM

## 2021-08-11 DIAGNOSIS — J40 Bronchitis, not specified as acute or chronic: Secondary | ICD-10-CM

## 2021-08-11 HISTORY — DX: Other complications of anesthesia, initial encounter: T88.59XA

## 2021-08-11 HISTORY — DX: Dyspnea, unspecified: R06.00

## 2021-08-11 HISTORY — DX: Other specified postprocedural states: Z98.890

## 2021-08-11 HISTORY — DX: Nausea with vomiting, unspecified: R11.2

## 2021-08-11 MED ORDER — ALBUTEROL SULFATE 0.63 MG/3ML IN NEBU: 1.0000 | INHALATION_SOLUTION | Freq: Four times a day (QID) | RESPIRATORY_TRACT | 0 refills | Status: DC | PRN

## 2021-08-11 MED ORDER — GUAIFENESIN-CODEINE 100-10 MG/5ML PO SYRP: 5.0000 mL | ORAL_SOLUTION | Freq: Every evening | ORAL | 0 refills | Status: DC | PRN

## 2021-08-11 MED ORDER — AMOXICILLIN-POT CLAVULANATE 875-125 MG PO TABS: 1.0000 | ORAL_TABLET | Freq: Two times a day (BID) | ORAL | 0 refills | Status: AC

## 2021-08-11 NOTE — Patient Instructions (Signed)
Albuterol Nebulizer Solution What is this medication? ALBUTEROL (al [place]) treats lung diseases, such as asthma, where the airways in the lungs narrow, causing breathing troubles or wheezing (bronchospasm). It is also used to treat asthma or prevent breathing problems during exercise. This medication works by opening the airways of the lungs, making it easier to breathe. It is often called a rescue- or quick-relief medication. This medicine may be used for other purposes; ask your health care provider or pharmacist if you have questions. COMMON BRAND NAME(S): Accuneb, Proventil What should I tell my care team before I take this medication? They need to know if you have any of these conditions: Diabetes (high blood sugar) Heart disease High blood pressure Irregular heartbeat or rhythm Pheochromocytoma Seizures Thyroid disease An unusual or allergic reaction to albuterol, other medications, foods, dyes, or preservatives Pregnant or trying to get pregnant Breast-feeding How should I use this medication? This medication is for inhalation using a nebulizer. Nebulizers make a liquid into an aerosol that you breathe in through your mouth or your mouth and nose and into your lungs. Do not mix this medication with other ones in the nebulizer. Take it as directed on the prescription label. Do not use it more often than directed. This medication comes with INSTRUCTIONS FOR USE. Ask your pharmacist for directions on how to use this medication. Read the information carefully. Talk to your pharmacist or care team if you have questions. Talk to your care team about the use of this medication in children. While it may be given to children as young as 2 for selected conditions, precautions do apply. Overdosage: If you think you have taken too much of this medicine contact a poison control center or emergency room at once. NOTE: This medicine is only for you. Do not share this medicine with others. What  if I miss a dose? If you take this medication on a regular basis, take it as soon as you can. If it is almost time for your next dose, take only that dose. Do not take double or extra doses. What may interact with this medication? Caffeine Chloroquine Cisapride Diuretics Medications for colds Medications for depression or for emotional or psychotic conditions Medications for weight loss including some herbal products Methadone Pentamidine Some antibiotics like clarithromycin, erythromycin, levofloxacin, and linezolid Some heart medications Steroid hormones like dexamethasone, cortisone, hydrocortisone Theophylline Thyroid hormones This list may not describe all possible interactions. Give your health care provider a list of all the medicines, herbs, non-prescription drugs, or dietary supplements you use. Also tell them if you smoke, drink alcohol, or use illegal drugs. Some items may interact with your medicine. What should I watch for while using this medication? Visit your care team for regular checks on your progress. Tell your care team if your symptoms do not start to get better or if they get worse. If your symptoms get worse or if you are using this medication more than normal, call your health care provider right away. Do not treat yourself for coughs, colds or allergies without asking your care team for advice. Some nonprescription medications can affect this one. You and your care team should develop an Asthma Action Plan that is just for you. Be sure to know what to do if you are in the yellow (asthma is getting worse) or red (medical alert) zones. Your mouth may get dry. Chewing sugarless gum or sucking hard candy and drinking plenty of water may help. Contact your care team if  the problem does not go away or is severe. What side effects may I notice from receiving this medication? Side effects that you should report to your care team as soon as possible: Allergic  reactions--skin rash, itching, hives, swelling of the face, lips, tongue, or throat Heart rhythm changes--fast or irregular heartbeat, dizziness, feeling faint or lightheaded, chest pain, trouble breathing Increase in blood pressure Muscle pain or cramps Wheezing or trouble breathing that is worse after use Side effects that usually do not require medical attention (report to your care team if they continue or are bothersome): Change in taste Dry mouth Headache Sore throat Tremors or shaking Trouble sleeping This list may not describe all possible side effects. Call your doctor for medical advice about side effects. You may report side effects to FDA at 1-800-FDA-1088. Where should I keep my medication? Keep out of the reach of children and pets. Store at room temperature between 20 and 25 degrees C (68 and 77 degrees F). Protect from light. Avoid exposure to extreme heat. Keep unopened vials in the foil pouch. See product for storage information. Each product may have different instructions. Get rid of any unused medication after it expires or 10 days after removing it from the foil pouch, whichever is first. To get rid of medications that are no longer needed or have expired: Take the medication to a medication take-back program. Check with your pharmacy or law enforcement to find a location. If you cannot return the medication, check the label or package insert to see if the medication should be thrown out in the garbage or flushed down the toilet. If you are not sure, ask your care team. If it is safe to put in the trash, empty the medication out of the container. Mix the medication with cat litter, dirt, coffee grounds, or other unwanted substance. Seal the mixture in a bag or container. Put it in the trash. NOTE: This sheet is a summary. It may not cover all possible information. If you have questions about this medicine, talk to your doctor, pharmacist, or health care provider.  2022  Elsevier/Gold Standard (2020-08-23 00:00:00) Amoxicillin; Clavulanic Acid Tablets What is this medication? AMOXICILLIN; CLAVULANIC ACID (a mox i SIL in; KLAV yoo lan ic AS id) treats infections caused by bacteria. It belongs to a group of medications called penicillin antibiotics. It will not treat colds, the flu, or infections caused by viruses. This medicine may be used for other purposes; ask your health care provider or pharmacist if you have questions. COMMON BRAND NAME(S): Augmentin What should I tell my care team before I take this medication? They need to know if you have any of these conditions: Kidney disease Liver disease Mononucleosis Stomach or intestine problems such as colitis An unusual or allergic reaction to amoxicillin, other penicillin or cephalosporin antibiotics, clavulanic acid, other medications, foods, dyes, or preservatives Pregnant or trying to get pregnant Breast-feeding How should I use this medication? Take this medication by mouth. Take it as directed on the prescription label at the same time every day. Take it with food at the start of a meal or snack. Take all of this medication unless your care team tells you to stop it early. Keep taking it even if you think you are better. Talk to your care team about the use of this medication in children. While it may be prescribed for selected conditions, precautions do apply. Overdosage: If you think you have taken too much of this medicine contact a poison  control center or emergency room at once. NOTE: This medicine is only for you. Do not share this medicine with others. What if I miss a dose? If you miss a dose, take it as soon as you can. If it is almost time for your next dose, take only that dose. Do not take double or extra doses. What may interact with this medication? Allopurinol Anticoagulants Birth control pills Methotrexate Probenecid This list may not describe all possible interactions. Give your  health care provider a list of all the medicines, herbs, non-prescription drugs, or dietary supplements you use. Also tell them if you smoke, drink alcohol, or use illegal drugs. Some items may interact with your medicine. What should I watch for while using this medication? Tell your care team if your symptoms do not start to get better or if they get worse. This medication may cause serious skin reactions. They can happen weeks to months after starting the medication. Contact your care team right away if you notice fevers or flu-like symptoms with a rash. The rash may be red or purple and then turn into blisters or peeling of the skin. Or, you might notice a red rash with swelling of the face, lips or lymph nodes in your neck or under your arms. Do not treat diarrhea with over the counter products. Contact your care team if you have diarrhea that lasts more than 2 days or if it is severe and watery. If you have diabetes, you may get a false-positive result for sugar in your urine. Check with your care team. Birth control may not work properly while you are taking this medication. Talk to your care team about using an extra method of birth control. What side effects may I notice from receiving this medication? Side effects that you should report to your care team as soon as possible: Allergic reactions--skin rash, itching, hives, swelling of the face, lips, tongue, or throat Liver injury--right upper belly pain, loss of appetite, nausea, light-colored stool, dark yellow or brown urine, yellowing skin or eyes, unusual weakness or fatigue Redness, blistering, peeling, or loosening of the skin, including inside the mouth Severe diarrhea, fever Unusual vaginal discharge, itching, or odor Side effects that usually do not require medical attention (report to your care team if they continue or are bothersome): Diarrhea Nausea Vomiting This list may not describe all possible side effects. Call your doctor  for medical advice about side effects. You may report side effects to FDA at 1-800-FDA-1088. Where should I keep my medication? Keep out of the reach of children and pets. Store at room temperature between 20 and 25 degrees C (68 and 77 degrees F). Throw away any unused medication after the expiration date. NOTE: This sheet is a summary. It may not cover all possible information. If you have questions about this medicine, talk to your doctor, pharmacist, or health care provider.  2022 Elsevier/Gold Standard (2020-09-01 00:00:00) Sinusitis, Adult Sinusitis is inflammation of your sinuses. Sinuses are hollow spaces in the bones around your face. Your sinuses are located: Around your eyes. In the middle of your forehead. Behind your nose. In your cheekbones. Mucus normally drains out of your sinuses. When your nasal tissues become inflamed or swollen, mucus can become trapped or blocked. This allows bacteria, viruses, and fungi to grow, which leads to infection. Most infections of the sinuses are caused by a virus. Sinusitis can develop quickly. It can last for up to 4 weeks (acute) or for more than 12 weeks (  chronic). Sinusitis often develops after a cold. What are the causes? This condition is caused by anything that creates swelling in the sinuses or stops mucus from draining. This includes: Allergies. Asthma. Infection from bacteria or viruses. Deformities or blockages in your nose or sinuses. Abnormal growths in the nose (nasal polyps). Pollutants, such as chemicals or irritants in the air. Infection from fungi (rare). What increases the risk? You are more likely to develop this condition if you: Have a weak body defense system (immune system). Do a lot of swimming or diving. Overuse nasal sprays. Smoke. What are the signs or symptoms? The main symptoms of this condition are pain and a feeling of pressure around the affected sinuses. Other symptoms include: Stuffy nose or  congestion. Thick drainage from your nose. Swelling and warmth over the affected sinuses. Headache. Upper toothache. A cough that may get worse at night. Extra mucus that collects in the throat or the back of the nose (postnasal drip). Decreased sense of smell and taste. Fatigue. A fever. Sore throat. Bad breath. How is this diagnosed? This condition is diagnosed based on: Your symptoms. Your medical history. A physical exam. Tests to find out if your condition is acute or chronic. This may include: Checking your nose for nasal polyps. Viewing your sinuses using a device that has a light (endoscope). Testing for allergies or bacteria. Imaging tests, such as an MRI or CT scan. In rare cases, a bone biopsy may be done to rule out more serious types of fungal sinus disease. How is this treated? Treatment for sinusitis depends on the cause and whether your condition is chronic or acute. If caused by a virus, your symptoms should go away on their own within 10 days. You may be given medicines to relieve symptoms. They include: Medicines that shrink swollen nasal passages (topical intranasal decongestants). Medicines that treat allergies (antihistamines). A spray that eases inflammation of the nostrils (topical intranasal corticosteroids). Rinses that help get rid of thick mucus in your nose (nasal saline washes). If caused by bacteria, your health care provider may recommend waiting to see if your symptoms improve. Most bacterial infections will get better without antibiotic medicine. You may be given antibiotics if you have: A severe infection. A weak immune system. If caused by narrow nasal passages or nasal polyps, you may need to have surgery. Follow these instructions at home: Medicines Take, use, or apply over-the-counter and prescription medicines only as told by your health care provider. These may include nasal sprays. If you were prescribed an antibiotic medicine, take it as  told by your health care provider. Do not stop taking the antibiotic even if you start to feel better. Hydrate and humidify  Drink enough fluid to keep your urine pale yellow. Staying hydrated will help to thin your mucus. Use a cool mist humidifier to keep the humidity level in your home above 50%. Inhale steam for 10-15 minutes, 3-4 times a day, or as told by your health care provider. You can do this in the bathroom while a hot shower is running. Limit your exposure to cool or dry air. Rest Rest as much as possible. Sleep with your head raised (elevated). Make sure you get enough sleep each night. General instructions  Apply a warm, moist washcloth to your face 3-4 times a day or as told by your health care provider. This will help with discomfort. Wash your hands often with soap and water to reduce your exposure to germs. If soap and water are  not available, use hand sanitizer. Do not smoke. Avoid being around people who are smoking (secondhand smoke). Keep all follow-up visits as told by your health care provider. This is important. Contact a health care provider if: You have a fever. Your symptoms get worse. Your symptoms do not improve within 10 days. Get help right away if: You have a severe headache. You have persistent vomiting. You have severe pain or swelling around your face or eyes. You have vision problems. You develop confusion. Your neck is stiff. You have trouble breathing. Summary Sinusitis is soreness and inflammation of your sinuses. Sinuses are hollow spaces in the bones around your face. This condition is caused by nasal tissues that become inflamed or swollen. The swelling traps or blocks the flow of mucus. This allows bacteria, viruses, and fungi to grow, which leads to infection. If you were prescribed an antibiotic medicine, take it as told by your health care provider. Do not stop taking the antibiotic even if you start to feel better. Keep all follow-up  visits as told by your health care provider. This is important. This information is not intended to replace advice given to you by your health care provider. Make sure you discuss any questions you have with your health care provider. Document Revised: 02/07/2018 Document Reviewed: 02/07/2018 Elsevier Patient Education  2022 [employer].

## 2021-08-11 NOTE — Patient Instructions (Addendum)
Your procedure is scheduled on: Tuesday August 26, 2021. Report to Day Surgery inside [facility] 2nd floor stop by admissions desk before getting on elevator. To find out your arrival time please call [phone] between 1PM - 3PM on Monday August 25, 2021.  Remember: Instructions that are not followed completely may result in serious medical risk,  up to and including death, or upon the discretion of your surgeon and anesthesiologist your  surgery may need to be rescheduled.     _X__ 1. Do not eat food or drink fluids after midnight the night before your procedure.                 No chewing gum or hard candies.  __X__2.  On the morning of surgery brush your teeth with toothpaste and water, you                may rinse your mouth with mouthwash if you wish.  Do not swallow any toothpaste or mouthwash.     _X__ 3.  No Alcohol for 24 hours before or after surgery.   _X__ 4.  Do Not Smoke or use e-cigarettes For 24 Hours Prior to Your Surgery.                 Do not use any chewable tobacco products for at least 6 hours prior to                 Surgery.  _X__  5.  Do not use any recreational drugs (marijuana, cocaine, heroin, ecstasy, MDMA or other)                For at least one week prior to your surgery.  Combination of these drugs with anesthesia                May have life threatening results.  __X__ 6.  Notify your doctor if there is any change in your medical condition      (cold, fever, infections).     Do not wear jewelry, make-up, hairpins, clips or nail polish. Do not wear lotions, powders, or perfumes. deodorant. Do not shave 48 hours prior to surgery. Men may shave face and neck. Do not bring valuables to the hospital.    [facility] is not responsible for any belongings or valuables.  Contacts, dentures or bridgework may not be worn into surgery. Leave your suitcase in the car. After surgery it may be brought to your room. For patients  admitted to the hospital, discharge time is determined by your treatment team.   Patients discharged the day of surgery will not be allowed to drive home.   Make arrangements for someone to be with you for the first 24 hours of your Same Day Discharge.   __X__ Take these medicines the morning of surgery with A SIP OF WATER:    1. lansoprazole (PREVACID) 30 MG   2.    3.   4.  5.  6.  ____ Fleet Enema (as directed)   __X__ Use Antibacterial Soap (or wipes) as directed  ____ Use Benzoyl Peroxide Gel as instructed  __X__ Use inhalers on the day of surgery  albuterol (PROAIR HFA) 108 (90 Base) MCG/ACT inhaler  budesonide-formoterol (SYMBICORT) 160-4.5 MCG/ACT inhaler  ____ Stop metformin 2 days prior to surgery    ____ Take 1/2 of usual insulin dose the night before surgery. No insulin the morning          of  surgery.   ____ Call your PCP, cardiologist, or Pulmonologist if taking Coumadin/Plavix/aspirin and ask when to stop before your surgery.   __X__ One Week prior to surgery- Stop Anti-inflammatories such as Ibuprofen, Aleve, Advil, Motrin, meloxicam (MOBIC), diclofenac, etodolac, ketorolac, Toradol, Daypro, piroxicam, Goody's or BC powders. OK TO USE TYLENOL IF NEEDED   __X__ One week prior to your surgery stop ALL supplements until after surgery.    ____ Bring C-Pap to the hospital.    If you have any questions regarding your pre-procedure instructions,  Please call Pre-admit Testing at [phone]

## 2021-08-11 NOTE — Progress Notes (Signed)
Virtual Visit via Video Note  I connected with Kristin Watkins on 08/11/21 at  2:30 PM EST by a video enabled telemedicine application and verified that I am speaking with the correct person using two identifiers.  Location: Patient: at home  Provider: Provider: Provider's office at  [facility], [place].      I discussed the limitations of evaluation and management by telemedicine and the availability of in person appointments. The patient expressed understanding and agreed to proceed.  History of Present Illness:  Patient is a 65 year old female in no acute distress who by video visit who started with sinus congestion 08/03/21. Sinus drainage was clear now is green thick discharge. Temperature 98.9.  She has been using nebulizer but it is expired albuterol. Pain in both cheeks. Post nasal thick discharge.  Clear productive chest congestion. Mild wheezing intermittently none currently.  Fatigue. Sore throat ear pain.  Tested x 2 for covid negative at home test. She denies any distress states " this feels like my bad sinusitis that is fixing to go into bronchitis"   History of pneumonia and bronchitis over 2-3 years ago.  Had flu and covid booster one month ago.  Patient  denies any fever, body aches,chills, rash, chest pain, shortness of breath, nausea, vomiting, or diarrhea.   Observations/Objective:   Patient is alert and oriented and responsive to questions Engages in conversation with provider. Speaks in full sentences without any pauses without any shortness of breath or distress.    Assessment and Plan: Declined respiratory panel flu/ covid/ rsv Declines x ray of chest at this time.   1. Acute non-recurrent sinusitis, unspecified location She has significant history of sinusitis in past given duration will go ahead and treat for bacterial infection.  - amoxicillin-clavulanate (AUGMENTIN) 875-125 MG tablet; Take 1 tablet by mouth 2 (two)  times daily for 10 days.  Dispense: 20 tablet; Refill: 0 - guaiFENesin-codeine (ROBITUSSIN AC) 100-10 MG/5ML syrup; Take 5 mLs by mouth at bedtime as needed for cough.  Dispense: 120 mL; Refill: 0  2. Bronchitis Refill requested. She has been using expired.  - albuterol (ACCUNEB) 0.63 MG/3ML nebulizer solution; Take 3 mLs (0.63 mg total) by nebulization every 6 (six) hours as needed for wheezing.  Dispense: 75 mL; Refill: 0  3. History of wheezing  - albuterol (ACCUNEB) 0.63 MG/3ML nebulizer solution; Take 3 mLs (0.63 mg total) by nebulization every 6 (six) hours as needed for wheezing.  Dispense: 75 mL; Refill: 0  Follow Up Instructions:    I discussed the assessment and treatment plan with the patient. The patient was provided an opportunity to ask questions and all were answered. The patient agreed with the plan and demonstrated an understanding of the instructions.   The patient was advised to call back or seek an in-person evaluation if the symptoms worsen or if the condition fails to improve as anticipated. Advised in person evaluation at anytime is advised if any symptoms do not improve, worsen or change at any given time.  Red Flags discussed. The patient was given clear instructions to go to ER or return to medical center if any red flags develop, symptoms do not improve, worsen or new problems develop. They verbalized understanding.  Return if symptoms worsen or fail to improve, for at any time for any worsening symptoms, Go to Emergency room/ urgent care if worse.  I provided 20 minutes of non-face-to-face time during this encounter.   Marcille Buffy, FNP

## 2021-08-17 ENCOUNTER — Other Ambulatory Visit: Payer: Self-pay | Admitting: Adult Health

## 2021-08-17 ENCOUNTER — Other Ambulatory Visit: Payer: Self-pay | Admitting: Orthopedic Surgery

## 2021-08-17 DIAGNOSIS — J40 Bronchitis, not specified as acute or chronic: Secondary | ICD-10-CM

## 2021-08-17 DIAGNOSIS — Z87898 Personal history of other specified conditions: Secondary | ICD-10-CM

## 2021-08-18 MED ORDER — ALBUTEROL SULFATE 0.63 MG/3ML IN NEBU: 1.0000 | INHALATION_SOLUTION | Freq: Four times a day (QID) | RESPIRATORY_TRACT | 1 refills | Status: AC | PRN

## 2021-08-20 ENCOUNTER — Telehealth: Admitting: Internal Medicine

## 2021-08-25 MED ORDER — CHLORHEXIDINE GLUCONATE CLOTH 2 % EX PADS: 6.0000 | MEDICATED_PAD | Freq: Once | CUTANEOUS | Status: DC

## 2021-08-25 MED ORDER — ORAL CARE MOUTH RINSE: 15.0000 mL | Freq: Once | OROMUCOSAL | Status: AC

## 2021-08-25 MED ORDER — CEFAZOLIN SODIUM-DEXTROSE 2-4 GM/100ML-% IV SOLN
2.0000 g | INTRAVENOUS | Status: AC
  Administered 2021-08-26: 2 g via INTRAVENOUS

## 2021-08-25 MED ORDER — CHLORHEXIDINE GLUCONATE 0.12 % MT SOLN
15.0000 mL | Freq: Once | OROMUCOSAL | Status: AC
  Administered 2021-08-26: 15 mL via OROMUCOSAL

## 2021-08-25 MED ORDER — ACETAMINOPHEN 500 MG PO TABS
1000.0000 mg | ORAL_TABLET | ORAL | Status: AC
  Administered 2021-08-26: 1000 mg via ORAL

## 2021-08-25 MED ORDER — CHLORHEXIDINE GLUCONATE CLOTH 2 % EX PADS
6.0000 | MEDICATED_PAD | Freq: Once | CUTANEOUS | Status: AC
  Administered 2021-08-26: 6 via TOPICAL

## 2021-08-25 MED ORDER — LACTATED RINGERS IV SOLN: INTRAVENOUS | Status: DC

## 2021-08-25 MED ORDER — APREPITANT 40 MG PO CAPS
40.0000 mg | ORAL_CAPSULE | Freq: Once | ORAL | Status: AC
  Administered 2021-08-26: 40 mg via ORAL

## 2021-08-26 ENCOUNTER — Ambulatory Visit
Admission: RE | Admit: 2021-08-26 | Discharge: 2021-08-26 | Disposition: A | Discharge status: Home / Self Care | Payer: Federal, State, Local not specified - PPO | Attending: Orthopedic Surgery | Admitting: Orthopedic Surgery

## 2021-08-26 ENCOUNTER — Other Ambulatory Visit: Payer: Self-pay

## 2021-08-26 ENCOUNTER — Encounter: Payer: Self-pay | Admitting: Orthopedic Surgery

## 2021-08-26 ENCOUNTER — Encounter
Admission: RE | Disposition: A | Discharge status: Home / Self Care | Payer: Self-pay | Source: Home / Self Care | Attending: Orthopedic Surgery

## 2021-08-26 ENCOUNTER — Ambulatory Visit: Payer: Federal, State, Local not specified - PPO | Admitting: Anesthesiology

## 2021-08-26 ENCOUNTER — Ambulatory Visit: Payer: Federal, State, Local not specified - PPO

## 2021-08-26 DIAGNOSIS — M7551 Bursitis of right shoulder: Secondary | ICD-10-CM | POA: Diagnosis not present

## 2021-08-26 DIAGNOSIS — J45909 Unspecified asthma, uncomplicated: Secondary | ICD-10-CM | POA: Diagnosis not present

## 2021-08-26 DIAGNOSIS — M25811 Other specified joint disorders, right shoulder: Secondary | ICD-10-CM | POA: Diagnosis not present

## 2021-08-26 DIAGNOSIS — M7541 Impingement syndrome of right shoulder: Secondary | ICD-10-CM | POA: Diagnosis not present

## 2021-08-26 DIAGNOSIS — K219 Gastro-esophageal reflux disease without esophagitis: Secondary | ICD-10-CM | POA: Diagnosis not present

## 2021-08-26 DIAGNOSIS — M75101 Unspecified rotator cuff tear or rupture of right shoulder, not specified as traumatic: Secondary | ICD-10-CM | POA: Diagnosis not present

## 2021-08-26 DIAGNOSIS — Z419 Encounter for procedure for purposes other than remedying health state, unspecified: Secondary | ICD-10-CM

## 2021-08-26 DIAGNOSIS — M75121 Complete rotator cuff tear or rupture of right shoulder, not specified as traumatic: Secondary | ICD-10-CM | POA: Diagnosis not present

## 2021-08-26 DIAGNOSIS — Z85828 Personal history of other malignant neoplasm of skin: Secondary | ICD-10-CM | POA: Diagnosis not present

## 2021-08-26 DIAGNOSIS — M19011 Primary osteoarthritis, right shoulder: Secondary | ICD-10-CM | POA: Insufficient documentation

## 2021-08-26 DIAGNOSIS — M25511 Pain in right shoulder: Secondary | ICD-10-CM | POA: Diagnosis not present

## 2021-08-26 DIAGNOSIS — G8918 Other acute postprocedural pain: Secondary | ICD-10-CM | POA: Diagnosis not present

## 2021-08-26 DIAGNOSIS — J42 Unspecified chronic bronchitis: Secondary | ICD-10-CM | POA: Diagnosis not present

## 2021-08-26 HISTORY — PX: SHOULDER ARTHROSCOPY WITH ROTATOR CUFF REPAIR AND SUBACROMIAL DECOMPRESSION: SHX5686

## 2021-08-26 SURGERY — SHOULDER ARTHROSCOPY WITH ROTATOR CUFF REPAIR AND SUBACROMIAL DECOMPRESSION
Anesthesia: General | Laterality: Right

## 2021-08-26 MED ORDER — 0.9 % SODIUM CHLORIDE (POUR BTL) OPTIME
TOPICAL | Status: DC | PRN
  Administered 2021-08-26: 500 mL

## 2021-08-26 MED ORDER — DEXAMETHASONE SODIUM PHOSPHATE 10 MG/ML IJ SOLN
INTRAMUSCULAR | Status: DC | PRN
  Administered 2021-08-26: 10 mg via INTRAVENOUS

## 2021-08-26 MED ORDER — PROPOFOL 10 MG/ML IV BOLUS
INTRAVENOUS | Status: DC | PRN
  Administered 2021-08-26: 50 ug/kg/min via INTRAVENOUS

## 2021-08-26 MED ORDER — BUPIVACAINE HCL (PF) 0.5 % IJ SOLN
INTRAMUSCULAR | Status: DC | PRN
  Administered 2021-08-26: 20 mL via PERINEURAL

## 2021-08-26 MED ORDER — EPINEPHRINE PF 1 MG/ML IJ SOLN
INTRAMUSCULAR | Status: DC | PRN
  Administered 2021-08-26: 4 mL

## 2021-08-26 MED ORDER — PROPOFOL 10 MG/ML IV BOLUS
INTRAVENOUS | Status: AC
  Filled 2021-08-26: qty 40

## 2021-08-26 MED ORDER — PHENYLEPHRINE HCL (PRESSORS) 10 MG/ML IV SOLN
INTRAVENOUS | Status: AC
  Filled 2021-08-26: qty 1

## 2021-08-26 MED ORDER — SUGAMMADEX SODIUM 200 MG/2ML IV SOLN
INTRAVENOUS | Status: DC | PRN
  Administered 2021-08-26: 120 mg via INTRAVENOUS

## 2021-08-26 MED ORDER — FENTANYL CITRATE (PF) 100 MCG/2ML IJ SOLN
INTRAMUSCULAR | Status: DC | PRN
  Administered 2021-08-26 (×2): 50 ug via INTRAVENOUS

## 2021-08-26 MED ORDER — BUPIVACAINE LIPOSOME 1.3 % IJ SUSP
INTRAMUSCULAR | Status: DC | PRN
  Administered 2021-08-26: 10 mL via PERINEURAL

## 2021-08-26 MED ORDER — PROPOFOL 10 MG/ML IV BOLUS
INTRAVENOUS | Status: AC
  Filled 2021-08-26: qty 20

## 2021-08-26 MED ORDER — ROCURONIUM BROMIDE 100 MG/10ML IV SOLN
INTRAVENOUS | Status: DC | PRN
Start: 2021-08-26 — End: 2021-08-26
  Administered 2021-08-26: 50 mg via INTRAVENOUS

## 2021-08-26 MED ORDER — FENTANYL CITRATE (PF) 100 MCG/2ML IJ SOLN: 25.0000 ug | INTRAMUSCULAR | Status: DC | PRN

## 2021-08-26 MED ORDER — NEOMYCIN-POLYMYXIN B GU 40-200000 IR SOLN
Status: DC | PRN
  Administered 2021-08-26: 2 mL

## 2021-08-26 MED ORDER — FENTANYL CITRATE (PF) 100 MCG/2ML IJ SOLN
INTRAMUSCULAR | Status: AC
  Filled 2021-08-26: qty 2

## 2021-08-26 MED ORDER — LIDOCAINE HCL (PF) 1 % IJ SOLN
INTRAMUSCULAR | Status: DC | PRN
  Administered 2021-08-26: 3 mL via INTRADERMAL

## 2021-08-26 MED ORDER — PHENYLEPHRINE HCL (PRESSORS) 10 MG/ML IV SOLN
INTRAVENOUS | Status: DC | PRN
  Administered 2021-08-26 (×4): 100 ug via INTRAVENOUS

## 2021-08-26 MED ORDER — OXYCODONE HCL 5 MG PO TABS: 5.0000 mg | ORAL_TABLET | ORAL | 0 refills | Status: DC | PRN

## 2021-08-26 MED ORDER — PROPOFOL 10 MG/ML IV BOLUS
INTRAVENOUS | Status: DC | PRN
  Administered 2021-08-26: 50 mg via INTRAVENOUS
  Administered 2021-08-26: 150 mg via INTRAVENOUS
  Administered 2021-08-26: 50 mg via INTRAVENOUS

## 2021-08-26 MED ORDER — ONDANSETRON HCL 4 MG/2ML IJ SOLN
INTRAMUSCULAR | Status: DC | PRN
  Administered 2021-08-26: 4 mg via INTRAVENOUS

## 2021-08-26 MED ORDER — FENTANYL CITRATE PF 50 MCG/ML IJ SOSY: 50.0000 ug | PREFILLED_SYRINGE | Freq: Once | INTRAMUSCULAR | Status: DC

## 2021-08-26 MED ORDER — LIDOCAINE HCL (CARDIAC) PF 100 MG/5ML IV SOSY
PREFILLED_SYRINGE | INTRAVENOUS | Status: DC | PRN
  Administered 2021-08-26: 80 mg via INTRAVENOUS

## 2021-08-26 MED ORDER — ONDANSETRON HCL 4 MG/2ML IJ SOLN: 4.0000 mg | Freq: Once | INTRAMUSCULAR | Status: DC | PRN

## 2021-08-26 MED ORDER — MIDAZOLAM HCL 2 MG/2ML IJ SOLN
1.0000 mg | INTRAMUSCULAR | Status: DC | PRN
  Administered 2021-08-26: 1 mg via INTRAVENOUS

## 2021-08-26 MED ORDER — ONDANSETRON HCL 4 MG PO TABS: 4.0000 mg | ORAL_TABLET | Freq: Three times a day (TID) | ORAL | 1 refills | Status: DC | PRN

## 2021-08-26 MED ORDER — RINGERS IRRIGATION IR SOLN
Status: DC | PRN
  Administered 2021-08-26: 12000 mL

## 2021-08-26 SURGICAL SUPPLY — 73 items
ADAPTER IRRIG TUBE 2 SPIKE SOL (ADAPTER) IMPLANT
ADPR TBG 2 SPK PMP STRL ASCP (ADAPTER)
ANCH SUT 5.5 KNTLS PEEK (Orthopedic Implant) ×2 IMPLANT
ANCH SUT Q-FX 2.8 (Anchor) ×2 IMPLANT
ANCHOR ALL-SUT Q-FIX 2.8 (Anchor) ×8 IMPLANT
ANCHOR SUT 5.5 MULTIFIX (Orthopedic Implant) ×4 IMPLANT
ANCHOR SUT 5.5MM MULTIFIX (Orthopedic Implant) IMPLANT
ANCHOR SUT BIOC ST 3X145 (Anchor) IMPLANT
BLADE SHAVER 4.5X7 STR FR (MISCELLANEOUS) IMPLANT
CANISTER SUCT LVC 12 LTR MEDI- (MISCELLANEOUS) IMPLANT
CANNULA 5.75X7 CRYSTAL CLEAR (CANNULA) ×4 IMPLANT
CANNULA PARTIAL THREAD 2X7 (CANNULA) ×2 IMPLANT
CANNULA TWIST IN 8.25X9CM (CANNULA) ×2 IMPLANT
CONNECTOR PERFECT PASSER (CONNECTOR) ×2 IMPLANT
COOLER POLAR GLACIER W/PUMP (MISCELLANEOUS) ×2 IMPLANT
DEVICE SUCT BLK HOLE OR FLOOR (MISCELLANEOUS) IMPLANT
DRAPE 3/4 80X56 (DRAPES) ×2 IMPLANT
DRAPE IMP U-DRAPE 54X76 (DRAPES) ×4 IMPLANT
DRAPE INCISE IOBAN 66X45 STRL (DRAPES) ×2 IMPLANT
DRAPE U-SHAPE 47X51 STRL (DRAPES) ×2 IMPLANT
DURAPREP 26ML APPLICATOR (WOUND CARE) ×8 IMPLANT
ELECT REM PT RETURN 9FT ADLT (ELECTROSURGICAL) ×2
ELECTRODE REM PT RTRN 9FT ADLT (ELECTROSURGICAL) ×1 IMPLANT
GAUZE SPONGE 4X4 12PLY STRL (GAUZE/BANDAGES/DRESSINGS) ×2 IMPLANT
GAUZE XEROFORM 1X8 LF (GAUZE/BANDAGES/DRESSINGS) ×2 IMPLANT
GLOVE SURG ORTHO LTX SZ9 (GLOVE) ×6 IMPLANT
GLOVE SURG UNDER POLY LF SZ9 (GLOVE) ×2 IMPLANT
GOWN STRL REUS TWL 2XL XL LVL4 (GOWN DISPOSABLE) ×2 IMPLANT
GOWN STRL REUS W/ TWL LRG LVL3 (GOWN DISPOSABLE) ×1 IMPLANT
GOWN STRL REUS W/TWL LRG LVL3 (GOWN DISPOSABLE) ×2
IV LACTATED RINGER IRRG 3000ML (IV SOLUTION) ×16
IV LR IRRIG 3000ML ARTHROMATIC (IV SOLUTION) ×8 IMPLANT
KIT STABILIZATION SHOULDER (MISCELLANEOUS) ×2 IMPLANT
KIT SUTURE 2.8 Q-FIX DISP (MISCELLANEOUS) ×2 IMPLANT
KIT SUTURETAK 3.0 INSERT PERC (KITS) IMPLANT
KIT TURNOVER KIT A (KITS) ×2 IMPLANT
MANIFOLD NEPTUNE II (INSTRUMENTS) ×2 IMPLANT
MASK FACE SPIDER DISP (MASK) ×2 IMPLANT
MAT ABSORB  FLUID 56X50 GRAY (MISCELLANEOUS) ×2
MAT ABSORB FLUID 56X50 GRAY (MISCELLANEOUS) ×2 IMPLANT
NDL SAFETY ECLIPSE 18X1.5 (NEEDLE) ×1 IMPLANT
NEEDLE HYPO 18GX1.5 SHARP (NEEDLE) ×2
NEEDLE HYPO 22GX1.5 SAFETY (NEEDLE) ×2 IMPLANT
NS IRRIG 500ML POUR BTL (IV SOLUTION) ×2 IMPLANT
PACK ARTHROSCOPY SHOULDER (MISCELLANEOUS) ×2 IMPLANT
PAD ARMBOARD 7.5X6 YLW CONV (MISCELLANEOUS) ×4 IMPLANT
PAD WRAPON POLAR SHDR XLG (MISCELLANEOUS) ×1 IMPLANT
PASSER SUT FIRSTPASS SELF (INSTRUMENTS) ×2 IMPLANT
SHAVER BLADE BONE CUTTER 4.5 (BLADE) ×2 IMPLANT
SHAVER BLADE TAPERED BLUNT 4 (BLADE) ×2 IMPLANT
SPONGE T-LAP 18X18 ~~LOC~~+RFID (SPONGE) ×2 IMPLANT
STRIP CLOSURE SKIN 1/2X4 (GAUZE/BANDAGES/DRESSINGS) ×2 IMPLANT
SUT ETHILON 4-0 (SUTURE) ×4
SUT ETHILON 4-0 FS2 18XMFL BLK (SUTURE) ×2
SUT LASSO 90 DEG SD STR (SUTURE) IMPLANT
SUT MNCRL 4-0 (SUTURE) ×2
SUT MNCRL 4-0 27XMFL (SUTURE) ×1
SUT PDS AB 0 CT1 27 (SUTURE) ×2 IMPLANT
SUT PERFECTPASSER WHITE CART (SUTURE) ×2 IMPLANT
SUT SMART STITCH CARTRIDGE (SUTURE) ×4 IMPLANT
SUT ULTRABRAID 2 COBRAID 38 (SUTURE) IMPLANT
SUT VIC AB 0 CT1 36 (SUTURE) ×2 IMPLANT
SUT VIC AB 2-0 CT2 27 (SUTURE) ×2 IMPLANT
SUTURE ETHLN 4-0 FS2 18XMF BLK (SUTURE) ×2 IMPLANT
SUTURE MNCRL 4-0 27XMF (SUTURE) ×1 IMPLANT
SYR 10ML LL (SYRINGE) IMPLANT
TAPE MICROFOAM 4IN (TAPE) ×2 IMPLANT
TUBING CONNECTING 10 (TUBING) ×2 IMPLANT
TUBING INFLOW SET DBFLO PUMP (TUBING) ×2 IMPLANT
TUBING OUTFLOW SET DBLFO PUMP (TUBING) ×2 IMPLANT
WAND WEREWOLF FLOW 90D (MISCELLANEOUS) ×2 IMPLANT
WATER STERILE IRR 500ML POUR (IV SOLUTION) IMPLANT
WRAPON POLAR PAD SHDR XLG (MISCELLANEOUS) ×2

## 2021-08-26 NOTE — Anesthesia Procedure Notes (Signed)
Anesthesia Regional Block: Interscalene brachial plexus block   Pre-Anesthetic Checklist: , timeout performed,  Correct Patient, Correct Site, Correct Laterality,  Correct Procedure, Correct Position, site marked,  Risks and benefits discussed,  Surgical consent,  Pre-op evaluation,  At surgeon's request and post-op pain management  Laterality: Right  Prep: chloraprep       Needles:  Injection technique: Single-shot  Needle Type: Echogenic Stimulator Needle     Needle Length: 10cm  Needle Gauge: 20     Additional Needles:   Procedures:, nerve stimulator,,, ultrasound used (permanent image in chart),,     Nerve Stimulator or Paresthesia:  Response: biceps flexion  Additional Responses:   Narrative:  Injection made incrementally with aspirations every 5 mL.  Performed by: Personally   Additional Notes: Functioning IV was confirmed and monitors were applied.   Sterile prep and drape,hand hygiene and sterile gloves were used.  Negative aspiration and negative test dose prior to incremental administration of local anesthetic. The patient tolerated the procedure well.

## 2021-08-26 NOTE — Op Note (Signed)
08/26/2021  5:24 PM  PATIENT:  Kristin Watkins  65 y.o. female  PRE-OPERATIVE DIAGNOSIS:  Right shoulder rotator Cuff Tear  POST-OPERATIVE DIAGNOSIS:  Right shoulder rotator Cuff Tear, subacromial impingement acromioclavicular joint arthrosis  PROCEDURE: Right shoulder arthroscopic subacromial decompression, distal clavicle excision and mini open rotator cuff repair.  SURGEON:  Surgeon(s) and Role:    * Thornton Park, MD - Primary  ANESTHESIA:   general and paracervical block  EBL: Minimal  PREOPERATIVE INDICATIONS:  Kristin Watkins is a  65 y.o. female with a diagnosis of Right Rotator Cuff Tear, followed by CT arthrogram, who failed conservative treatment and elected for surgical management.    The risks benefits and alternatives were discussed with the patient preoperatively including but not limited to the risks of infection, bleeding, nerve injury, persistent pain or weakness, shoulder stiffness/arthrofibrosis, failure of the repair, re-tear of the rotator cuff and the need for further surgery. Medical risks include DVT and pulmonary embolism, myocardial infarction, stroke, pneumonia, respiratory failure and death. Patient understood these risks and wished to proceed.  OPERATIVE IMPLANTS: [place] MultiFix anchor x 1 & [employer] Q Fix anchors x 2  OPERATIVE FINDINGS: Full-thickness rotator cuff tear of the supraspinatus, fraying of the subscapularis without tear, subacromial spur and acromioclavicular joint arthrosis  OPERATIVE PROCEDURE: The patient was met in the preoperative area. The right shoulder was signed with the word yes and my initials according the hospital's correct site of surgery protocol.   A pre-op history and physical was performed at the bedside.  Patient underwent an interscalene block with Exparel by the anesthesia service.  Patient was brought to the operating room where she underwent general endotracheal intubation.  The patient was placed in a  beachchair position.  A spider arm positioner was used for this case.  Examination under anesthesia revealed no loss of passive range of motion or instability with load shift testing. The patient had a negative sulcus sign.  Patient was prepped and draped in a sterile fashion. A timeout was performed to verify the patient's name, date of birth, medical record number, correct site of surgery and correct procedure to be performed there was also used to verify the patient received antibiotics that all appropriate instruments, implants and radiographs studies were available in the room. Once all in attendance were in agreement case began.  Patient received ancef 2 grams IV for pre-op antibiotics.  Bony landmarks were drawn out with a surgical marker along with proposed arthroscopy incisions.  An 11 blade was used to establish a posterior portal through which the arthroscope was placed in the glenohumeral joint. A full diagnostic examination of the shoulder was performed.  Findings on arthroscopy are listed above.   A lateral portal was created and a Dyon[employer] flyer shaver blade was used to debride the rotator cuff and tuberosity all torn fibers of the rotator cuff till punctate bleeding was identified.  The arthroscope was then placed in the subacromial space.  Extensive bursitis was encountered and debrided using a Dyon[employer] flyer shaver blade and a 48 [place] werewolf wand from a lateral portal. A subacromial decompression was performed using a Dyonics 4.5 bone cutter shaver blade from the lateral portal.   A distal clavicle excision was then performed through the anterior portal also using the 4.5 bone cutter shaver blade.  Three perfect Pass sutures were placed in the lateral border of the rotator cuff tear. All arthroscopic instruments were then removed and the mini-open portion  of the procedure began.  A saber-type incision was made along the lateral border of the acromion. The deltoid muscle was  identified and split in line with its fibers which allowed visualization of the rotator cuff. The Perfect Pass sutures previously placed in the lateral border of the rotator cuff were brought out through the deltoid split.  Two [employer] Fix anchors were placed at the articular margin of the humeral head with the greater tuberosity. The suture limbs of the Q Fix anchors were passed medially through the rotator cuff using a First Pass suture passer.   The three Perfect Pass sutures were then anchored to the greater tuberosity footprint using a single [employer] Multifix anchor. The sutures passed through the Multifix anchor were then tensioned to allow reduction of the rotator cuff to the greater tuberosity footprint. The medial row repair was then performed using the Q fix sutures, which were tied down using an arthroscopic knot tying technique.  Arthroscopic images of the repair were taken with the arthroscope both externally and from inside the glenohumeral joint.  All incisions were copiously irrigated. The deltoid fascia was repaired using a 0 Vicryl suture.  The subcutaneous tissue of all incisions were closed with a 2-0 Vicryl. Skin closure for the arthroscopic incisions was performed with 4-0 nylon. The skin edges of the saber incision was approximated with a running 4-0 undyed Monocryl.  A dry sterile dressing was applied.  The patient was placed in an abduction sling and a Polar Care was applied to the shoulder.  All sharp, sponge and it instrument counts were correct at the conclusion of the case. I was scrubbed and present for the entire case. I spoke with the patient's partner postoperatively to let her know the case had been performed without complication and the patient was stable in recovery room.  I reviewed the postop instructions with her and answered all her questions.

## 2021-08-26 NOTE — H&P (Signed)
PREOPERATIVE H&P  Chief Complaint: Right Rotator Cuff Tear  HPI: Kristin Watkins is a 65 y.o. female who presents for preoperative history and physical with a diagnosis of Right Rotator Cuff Tear. Symptoms of pain, instability and weakness are significantly impairing activities of daily living.  Patient has failed non-operative management and wished to proceed with surgical fixation.   Past Medical History:  Diagnosis Date   Allergy    Asthma    Blood transfusion without reported diagnosis    Cancer ([place])    skin ca   Chronic bronchitis (HCC)    Complication of anesthesia    Dyspnea    GERD (gastroesophageal reflux disease)    Migraines    PONV (postoperative nausea and vomiting)    Past Surgical History:  Procedure Laterality Date   BACK SURGERY  2010   lumbar surgery 1989 and Spinal stimulator in 2010   CERVICAL FUSION     C5-T1   ELBOW SURGERY Right 11/2020   ulnar nerve transposition   ESOPHAGOGASTRODUODENOSCOPY  2015   normal   EXCISION/RELEASE BURSA HIP Right 10/2020   Left shoulder surgery      Social History   Socioeconomic History   Marital status: Married    Spouse name: Not on file   Number of children: Not on file   Years of education: Not on file   Highest education level: Not on file  Occupational History   Not on file  Tobacco Use   Smoking status: Never   Smokeless tobacco: Never  Vaping Use   Vaping Use: Never used  Substance and Sexual Activity   Alcohol use: Yes    Alcohol/week: 1.0 - 2.0 standard drink    Types: 1 - 2 Glasses of wine per week    Comment: occasionally   Drug use: No   Sexual activity: Yes  Other Topics Concern   Not on file  Social History Narrative   Not on file   Social Determinants of Health   Financial Resource Strain: Not on file  Food Insecurity: Not on file  Transportation Needs: Not on file  Physical Activity: Not on file  Stress: Not on file  Social Connections: Not on file   Family History  Problem  Relation Age of Onset   Diabetes Father    Renal cancer Father    Breast cancer Neg Hx    Allergies  Allergen Reactions   Tape Itching    Tegaderm tape give rash    Prior to Admission medications   Medication Sig Start Date End Date Taking? Authorizing Provider  acetaminophen (TYLENOL) 500 MG tablet Take 500 mg by mouth every 6 (six) hours as needed.   Yes [provider]  budesonide (PULMICORT) 180 MCG/ACT inhaler Inhale into the lungs 2 (two) times daily.   Yes [provider]  budesonide-formoterol (SYMBICORT) 160-4.5 MCG/ACT inhaler Inhale 2 puffs into the lungs 2 (two) times daily. 02/26/21  Yes Glean Hess, MD  gabapentin (NEURONTIN) 300 MG capsule Take 300 mg by mouth daily. 300-600 09/25/18  Yes [provider]  guaiFENesin-codeine (ROBITUSSIN AC) 100-10 MG/5ML syrup Take 5 mLs by mouth at bedtime as needed for cough. 08/11/21  Yes Flinchum, Kelby Aline, FNP  lansoprazole (PREVACID) 30 MG capsule TAKE 1 CAPSULE (30 MG TOTAL) BY MOUTH DAILY AT 12 NOON. 04/08/18  Yes Glean Hess, MD  meloxicam (MOBIC) 7.5 MG tablet Take 7.5 mg by mouth in the morning and at bedtime.   Yes [provider]  Misc Natural Products (IMMUNE TRIO PO) Take by mouth.   Yes [provider]  montelukast (SINGULAIR) 10 MG tablet TAKE 1 TABLET BY MOUTH EVERY DAY 05/19/21  Yes Crecencio Mc, MD  albuterol (ACCUNEB) 0.63 MG/3ML nebulizer solution Take 3 mLs (0.63 mg total) by nebulization every 6 (six) hours as needed for wheezing. 08/18/21   Crecencio Mc, MD  CAMBIA 50 MG PACK TAKE 50 MG BY MOUTH DAILY. 09/22/19   Glean Hess, MD  diclofenac Sodium (VOLTAREN) 1 % GEL Apply 4 g topically 4 (four) times daily. 01/10/20   Glean Hess, MD  ondansetron (ZOFRAN-ODT) 4 MG disintegrating tablet Take 4 mg by mouth every 6 (six) hours as needed. 10/31/20   [provider]  tiZANidine (ZANAFLEX) 2 MG tablet Take 1 or 2 tabs by mouth TID prn 03/27/20    [provider]  triamcinolone cream (KENALOG) 0.1 % Apply 1 application topically 2 (two) times daily. 04/21/19   Glean Hess, MD     Positive ROS: All other systems have been reviewed and were otherwise negative with the exception of those mentioned in the HPI and as above.  Physical Exam: General: Alert, no acute distress Cardiovascular: Regular rate and rhythm, no murmurs rubs or gallops.  No pedal edema Respiratory: Clear to auscultation bilaterally, no wheezes rales or rhonchi. No cyanosis, no use of accessory musculature GI: No organomegaly, abdomen is soft and non-tender nondistended with positive bowel sounds. Skin: Skin intact, no lesions within the operative field. Neurologic: Sensation intact distally Psychiatric: Patient is competent for consent with normal mood and affect Lymphatic: No cervical lymphadenopathy  MUSCULOSKELETAL: Right Shoulder: The patient can forward elevate and abduct to approximately 150-160 degrees. Mild pain. She demonstrates no obvious weakness of the rotator cuff, but had increased pain with resisted shoulder abduction at 90 degrees. She has mild pain with external rotation to 90 degrees and internal rotation to 60 degrees when the shoulder is abducted 90 degrees. She has full digital, wrist and elbow range of motion, intact sensation to light touch and a palpable radial pulse. She does not demonstrate apprehension or obvious instability.     Assessment: Right Rotator Cuff Tear  Plan: Plan for Procedure(s): RIGHT SHOULDER ARTHROSCOPIC SUBACROMIAL DECOMPRESSION, DISTAL CLAVICLE EXCISION WITH MINI-OPEN ROTATOR CUFF REPAIR   I reviewed the details of the operation and post-op course with the patient.  I marked the right shoulder per the hosptial's protocol.  A pre-op H&P was performed at the bedside.  I have reviewed the patient's MRI in preparation for this case.    I discussed the risks and benefits of surgery. The risks include but are  not limited to infection, bleeding, nerve or blood vessel injury, joint stiffness or loss of motion, persistent pain, weakness or instability, retear of the rotator cuff failure of the repair and hardware failure and the need for further surgery. Medical risks include but are not limited to DVT and pulmonary embolism, myocardial infarction, stroke, pneumonia, respiratory failure and death. Patient understood these risks and wished to proceed.    Thornton Park, MD   08/26/2021 7:54 AM

## 2021-08-26 NOTE — Anesthesia Preprocedure Evaluation (Signed)
Anesthesia Evaluation  Patient identified by MRN, date of birth, ID band Patient awake    Reviewed: Allergy & Precautions, H&P , NPO status , Patient's Chart, lab work & pertinent test results, reviewed documented beta blocker date and time   History of Anesthesia Complications (+) PONV and history of anesthetic complications  Airway Mallampati: II  TM Distance: >3 FB Neck ROM: full    Dental  (+) Teeth Intact   Pulmonary neg pulmonary ROS, shortness of breath and with exertion, asthma ,    Pulmonary exam normal        Cardiovascular Exercise Tolerance: Good negative cardio ROS Normal cardiovascular exam Rhythm:regular Rate:Normal     Neuro/Psych  Headaches,  Neuromuscular disease negative neurological ROS  negative psych ROS   GI/Hepatic Neg liver ROS, GERD  Medicated,  Endo/Other  negative endocrine ROS  Renal/GU negative Renal ROS  negative genitourinary   Musculoskeletal   Abdominal   Peds  Hematology negative hematology ROS (+)   Anesthesia Other Findings Past Medical History: No date: Allergy No date: Asthma No date: Blood transfusion without reported diagnosis No date: Cancer [facility])     Comment:  skin ca No date: Chronic bronchitis (HCC) No date: Complication of anesthesia No date: Dyspnea No date: GERD (gastroesophageal reflux disease) No date: Migraines No date: PONV (postoperative nausea and vomiting) Past Surgical History: 2010: BACK SURGERY     Comment:  lumbar surgery 1989 and Spinal stimulator in 2010 No date: CERVICAL FUSION     Comment:  C5-T1 11/2020: ELBOW SURGERY; Right     Comment:  ulnar nerve transposition 2015: ESOPHAGOGASTRODUODENOSCOPY     Comment:  normal 10/2020: EXCISION/RELEASE BURSA HIP; Right No date: Left shoulder surgery  BMI    Body Mass Index: 21.80 kg/m     Reproductive/Obstetrics negative OB ROS                             Anesthesia  Physical Anesthesia Plan  ASA: 2  Anesthesia Plan: General ETT and General   Post-op Pain Management: Regional block   Induction: Intravenous  PONV Risk Score and Plan: 4 or greater  Airway Management Planned: Video Laryngoscope Planned  Additional Equipment:   Intra-op Plan:   Post-operative Plan:   Informed Consent: I have reviewed the patients History and Physical, chart, labs and discussed the procedure including the risks, benefits and alternatives for the proposed anesthesia with the patient or authorized representative who has indicated his/her understanding and acceptance.     Dental Advisory Given  Plan Discussed with: CRNA  Anesthesia Plan Comments: (Spoke with patient about isnb and risks and benefits accepted. ja)        Anesthesia Quick Evaluation

## 2021-08-26 NOTE — Discharge Instructions (Signed)

## 2021-08-26 NOTE — Transfer of Care (Signed)
Immediate Anesthesia Transfer of Care Note  Patient: Kristin Watkins  Procedure(s) Performed: SHOULDER ARTHROSCOPY WITH ROTATOR CUFF REPAIR AND SUBACROMIAL DECOMPRESSION (Right)  Patient Location: PACU  Anesthesia Type:General  Level of Consciousness: awake, alert  and oriented  Airway & Oxygen Therapy: Patient Spontanous Breathing  Post-op Assessment: Report given to RN and Post -op Vital signs reviewed and stable  Post vital signs: Reviewed and stable  Last Vitals:  Vitals Value Taken Time  BP    Temp    Pulse    Resp    SpO2      Last Pain:  Vitals:   08/26/21 0628  TempSrc: Temporal  PainSc: 3          Complications: No notable events documented.

## 2021-08-26 NOTE — Anesthesia Procedure Notes (Signed)
Procedure Name: Intubation Date/Time: 08/26/2021 8:10 AM Performed by: Chanetta Marshall, CRNA Pre-anesthesia Checklist: Patient identified, Emergency Drugs available, Suction available and Patient being monitored Patient Re-evaluated:Patient Re-evaluated prior to induction Oxygen Delivery Method: Circle system utilized Preoxygenation: Pre-oxygenation with 100% oxygen Induction Type: IV induction Ventilation: Mask ventilation without difficulty Laryngoscope Size: McGraph and 3 Grade View: Grade I Tube type: Oral Tube size: 6.5 mm Number of attempts: 1 Airway Equipment and Method: Stylet and Video-laryngoscopy Placement Confirmation: ETT inserted through vocal cords under direct vision, positive ETCO2, breath sounds checked- equal and bilateral and CO2 detector Secured at: 20 cm Tube secured with: Tape Dental Injury: Teeth and Oropharynx as per pre-operative assessment

## 2021-08-27 NOTE — Anesthesia Postprocedure Evaluation (Signed)
Anesthesia Post Note  Patient: Rhylan Gross  Procedure(s) Performed: SHOULDER ARTHROSCOPY WITH ROTATOR CUFF REPAIR AND SUBACROMIAL DECOMPRESSION (Right)  Patient location during evaluation: PACU Anesthesia Type: General Level of consciousness: awake and alert Pain management: pain level controlled Vital Signs Assessment: post-procedure vital signs reviewed and stable Respiratory status: spontaneous breathing, nonlabored ventilation, respiratory function stable and patient connected to nasal cannula oxygen Cardiovascular status: blood pressure returned to baseline and stable Postop Assessment: no apparent nausea or vomiting Anesthetic complications: no   No notable events documented.   Last Vitals:  Vitals:   08/26/21 1115 08/26/21 1128  BP: (!) 145/81 (!) 144/82  Pulse: 76 80  Resp: 18 14  Temp: (!) 36 C (!) 36.3 C  SpO2: 99% 100%    Last Pain:  Vitals:   08/27/21 0900  TempSrc:   PainSc: [facility]

## 2021-09-05 DIAGNOSIS — M25511 Pain in right shoulder: Secondary | ICD-10-CM | POA: Diagnosis not present

## 2021-09-05 DIAGNOSIS — M13811 Other specified arthritis, right shoulder: Secondary | ICD-10-CM | POA: Diagnosis not present

## 2021-10-13 DIAGNOSIS — Z86018 Personal history of other benign neoplasm: Secondary | ICD-10-CM | POA: Diagnosis not present

## 2021-10-13 DIAGNOSIS — L578 Other skin changes due to chronic exposure to nonionizing radiation: Secondary | ICD-10-CM | POA: Diagnosis not present

## 2021-10-13 DIAGNOSIS — Z85828 Personal history of other malignant neoplasm of skin: Secondary | ICD-10-CM | POA: Diagnosis not present

## 2021-10-13 DIAGNOSIS — Z872 Personal history of diseases of the skin and subcutaneous tissue: Secondary | ICD-10-CM | POA: Diagnosis not present

## 2021-10-13 DIAGNOSIS — L570 Actinic keratosis: Secondary | ICD-10-CM | POA: Diagnosis not present

## 2021-10-17 ENCOUNTER — Encounter: Payer: Self-pay | Admitting: Internal Medicine

## 2021-10-17 ENCOUNTER — Other Ambulatory Visit: Payer: Self-pay

## 2021-10-17 ENCOUNTER — Ambulatory Visit (INDEPENDENT_AMBULATORY_CARE_PROVIDER_SITE_OTHER): Payer: Federal, State, Local not specified - PPO | Admitting: Internal Medicine

## 2021-10-17 VITALS — BP 148/92 | HR 101 | Temp 97.9°F | Ht 64.0 in | Wt 132.6 lb

## 2021-10-17 DIAGNOSIS — R5383 Other fatigue: Secondary | ICD-10-CM

## 2021-10-17 DIAGNOSIS — R7303 Prediabetes: Secondary | ICD-10-CM

## 2021-10-17 DIAGNOSIS — R030 Elevated blood-pressure reading, without diagnosis of hypertension: Secondary | ICD-10-CM | POA: Diagnosis not present

## 2021-10-17 DIAGNOSIS — Z1231 Encounter for screening mammogram for malignant neoplasm of breast: Secondary | ICD-10-CM

## 2021-10-17 DIAGNOSIS — E782 Mixed hyperlipidemia: Secondary | ICD-10-CM

## 2021-10-17 DIAGNOSIS — F5102 Adjustment insomnia: Secondary | ICD-10-CM

## 2021-10-17 DIAGNOSIS — Z9889 Other specified postprocedural states: Secondary | ICD-10-CM

## 2021-10-17 DIAGNOSIS — G4484 Primary exertional headache: Secondary | ICD-10-CM

## 2021-10-17 DIAGNOSIS — F341 Dysthymic disorder: Secondary | ICD-10-CM

## 2021-10-17 DIAGNOSIS — G44329 Chronic post-traumatic headache, not intractable: Secondary | ICD-10-CM

## 2021-10-17 DIAGNOSIS — R002 Palpitations: Secondary | ICD-10-CM

## 2021-10-17 LAB — CBC WITH DIFFERENTIAL/PLATELET
Basophils Absolute: 0 10*3/uL (ref 0.0–0.1)
Basophils Relative: 0.5 % (ref 0.0–3.0)
Eosinophils Absolute: 0.1 10*3/uL (ref 0.0–0.7)
Eosinophils Relative: 0.9 % (ref 0.0–5.0)
HCT: 41.5 % (ref 36.0–46.0)
Hemoglobin: 13.8 g/dL (ref 12.0–15.0)
Lymphocytes Relative: 21.7 % (ref 12.0–46.0)
Lymphs Abs: 1.3 10*3/uL (ref 0.7–4.0)
MCHC: 33.3 g/dL (ref 30.0–36.0)
MCV: 87.6 fl (ref 78.0–100.0)
Monocytes Absolute: 0.4 10*3/uL (ref 0.1–1.0)
Monocytes Relative: 6.4 % (ref 3.0–12.0)
Neutro Abs: 4.1 10*3/uL (ref 1.4–7.7)
Neutrophils Relative %: 70.5 % (ref 43.0–77.0)
Platelets: 285 10*3/uL (ref 150.0–400.0)
RBC: 4.74 Mil/uL (ref 3.87–5.11)
RDW: 13.8 % (ref 11.5–15.5)
WBC: 5.8 10*3/uL (ref 4.0–10.5)

## 2021-10-17 LAB — LIPID PANEL
Cholesterol: 245 mg/dL — ABNORMAL HIGH (ref 0–200)
HDL: 89.8 mg/dL (ref >39.00)
LDL Cholesterol: 138 mg/dL — ABNORMAL HIGH (ref 0–99)
NonHDL: 155.14
Total CHOL/HDL Ratio: 3
Triglycerides: 85 mg/dL (ref 0.0–149.0)
VLDL: 17 mg/dL (ref 0.0–40.0)

## 2021-10-17 LAB — COMPREHENSIVE METABOLIC PANEL
ALT: 11 U/L (ref 0–35)
AST: 14 U/L (ref 0–37)
Albumin: 4.7 g/dL (ref 3.5–5.2)
Alkaline Phosphatase: 53 U/L (ref 39–117)
BUN: 14 mg/dL (ref 6–23)
CO2: 31 mEq/L (ref 19–32)
Calcium: 10.9 mg/dL — ABNORMAL HIGH (ref 8.4–10.5)
Chloride: 101 mEq/L (ref 96–112)
Creatinine, Ser: 0.73 mg/dL (ref 0.40–1.20)
GFR: 87.01 mL/min (ref >60.00)
Glucose, Bld: 94 mg/dL (ref 70–99)
Potassium: 3.8 mEq/L (ref 3.5–5.1)
Sodium: 139 mEq/L (ref 135–145)
Total Bilirubin: 0.6 mg/dL (ref 0.2–1.2)
Total Protein: 7.1 g/dL (ref 6.0–8.3)

## 2021-10-17 LAB — MICROALBUMIN / CREATININE URINE RATIO
Creatinine,U: 29.9 mg/dL
Microalb Creat Ratio: 2.3 mg/g (ref 0.0–30.0)
Microalb, Ur: <0.7 mg/dL (ref 0.0–1.9)

## 2021-10-17 LAB — TSH: TSH: 0.87 u[IU]/mL (ref 0.35–5.50)

## 2021-10-17 MED ORDER — GABAPENTIN 300 MG PO CAPS
300.0000 mg | ORAL_CAPSULE | Freq: Two times a day (BID) | ORAL | 3 refills | Status: DC
Start: 2021-10-17 — End: 2022-05-15

## 2021-10-17 MED ORDER — DICLOFENAC POTASSIUM(MIGRAINE) 50 MG PO PACK: PACK | ORAL | 2 refills | Status: DC

## 2021-10-17 MED ORDER — METOPROLOL SUCCINATE ER 25 MG PO TB24: 25.0000 mg | ORAL_TABLET | Freq: Every day | ORAL | 3 refills | Status: DC

## 2021-10-17 NOTE — Patient Instructions (Addendum)
Consider zoloft  for the anxiety  Consider metoprolol for 1) BP 2) headache prevention 3) elevated pulse   For the insomnia :  Try taking the melatonin at dinner time  max effective dose 6 mg   Will  add trazodone if needed   Cardiology referral  for multiple issues

## 2021-10-17 NOTE — Progress Notes (Signed)
Subjective:  Patient ID: Kristin Watkins, female    DOB: 1956/06/17  Age: 66 y.o. MRN: [id number]  CC: The primary encounter diagnosis was Other fatigue. Diagnoses of Encounter for screening mammogram for malignant neoplasm of breast, Pre-diabetes, Elevated blood pressure reading, Palpitations, Mixed hyperlipidemia, Primary exertional headache, S/P arthroscopy of right shoulder, Chronic intermittent post-traumatic headache, Hypercalcemia, Dysthymia, and Insomnia due to psychological stress were also pertinent to this visit.   This visit occurred during the SARS-CoV-2 public health emergency.  Safety protocols were in place, including screening questions prior to the visit, additional usage of staff PPE, and extensive cleaning of exam room while observing appropriate contact time as indicated for disinfecting solutions.    HPI Kristin Watkins presents for follow up on multiple issues including new onset hypertension  Chief Complaint  Patient presents with   Follow-up    6 month follow up    1)  S/P right supraspinatus TENDON REPAIR AND bursectomy 7 weeks ago.  Pain has been relieved significantly,  passive ROM improving.  2) Elevated blood pressure readings at home for the past several months despite stopping daily use of meloxicam.   Average reading is 145/90 .  Cites pain and anxiety as possible contributors;  she is aggravated by situation that has developed at work .  Her home life has been disrupted as well by her wife's brother who has moved in with them for health reasons.   She notes that her resting heart rate is now 80, has been 60 bpm for decades due to being physically fit, and the elevation has been concerning her a great deal. She has not been able to exercise in over 2 months due to her shoulder except for walking her dogs , which she admits is her only stress free activity lately .     3) Recurrent  headaches since a remote head injury.  Using diclofenac powder prn,  not using  daily. requests refill    Outpatient Medications Prior to Visit  Medication Sig Dispense Refill   acetaminophen (TYLENOL) 500 MG tablet Take 500 mg by mouth every 6 (six) hours as needed.     albuterol (ACCUNEB) 0.63 MG/3ML nebulizer solution Take 3 mLs (0.63 mg total) by nebulization every 6 (six) hours as needed for wheezing. 75 mL 1   budesonide (PULMICORT) 180 MCG/ACT inhaler Inhale into the lungs 2 (two) times daily.     budesonide-formoterol (SYMBICORT) 160-4.5 MCG/ACT inhaler Inhale 2 puffs into the lungs 2 (two) times daily. 1 each 5   diclofenac Sodium (VOLTAREN) 1 % GEL Apply 4 g topically 4 (four) times daily. 150 g 2   Misc Natural Products (IMMUNE TRIO PO) Take by mouth.     montelukast (SINGULAIR) 10 MG tablet TAKE 1 TABLET BY MOUTH EVERY DAY 90 tablet 0   tiZANidine (ZANAFLEX) 2 MG tablet Take 1 or 2 tabs by mouth TID prn     triamcinolone cream (KENALOG) 0.1 % Apply 1 application topically 2 (two) times daily. 80 g 0   CAMBIA 50 MG PACK TAKE 50 MG BY MOUTH DAILY. 27 each 2   gabapentin (NEURONTIN) 300 MG capsule Take 300 mg by mouth daily. 300-600     meloxicam (MOBIC) 7.5 MG tablet Take 7.5 mg by mouth in the morning and at bedtime.     guaiFENesin-codeine (ROBITUSSIN AC) 100-10 MG/5ML syrup Take 5 mLs by mouth at bedtime as needed for cough. 120 mL 0   lansoprazole (PREVACID) 30 MG capsule TAKE  1 CAPSULE (30 MG TOTAL) BY MOUTH DAILY AT 12 NOON. (Patient not taking: Reported on 10/17/2021) 90 capsule 1   ondansetron (ZOFRAN) 4 MG tablet Take 1 tablet (4 mg total) by mouth every 8 (eight) hours as needed for nausea or vomiting. 30 tablet 1   oxyCODONE (OXY IR/ROXICODONE) 5 MG immediate release tablet Take 1 tablet (5 mg total) by mouth every 4 (four) hours as needed. 40 tablet 0   No facility-administered medications prior to visit.    Review of Systems;  Patient denies , fevers, malaise, unintentional weight loss, skin rash, eye pain, sinus congestion and sinus pain, sore  throat, dysphagia,  hemoptysis , cough, dyspnea, wheezing, chest pain, palpitations, orthopnea, edema, abdominal pain, nausea, melena, diarrhea, constipation, flank pain, dysuria, hematuria, urinary  Frequency, nocturia, numbness, tingling, seizures,  Focal weakness, Loss of consciousness,  Tremor, insomnia, depression, and suicidal ideation.      Objective:  BP (!) 148/92 (BP Location: Left Arm, Patient Position: Sitting, Cuff Size: Normal)    Pulse (!) 101    Temp 97.9 F (36.6 C) (Oral)    Ht 5' 4"  (1.626 m)    Wt 132 lb 9.6 oz (60.1 kg)    SpO2 99%    BMI 22.76 kg/m   BP Readings from Last 3 Encounters:  10/17/21 (!) 148/92  08/26/21 (!) 144/82  04/16/21 138/80    Wt Readings from Last 3 Encounters:  10/17/21 132 lb 9.6 oz (60.1 kg)  08/26/21 127 lb (57.6 kg)  08/11/21 127 lb (57.6 kg)    General appearance: alert, cooperative and appears stated age Ears: normal TM's and external ear canals both ears Throat: lips, mucosa, and tongue normal; teeth and gums normal Neck: no adenopathy, no carotid bruit, supple, symmetrical, trachea midline and thyroid not enlarged, symmetric, no tenderness/mass/nodules Back: symmetric, no curvature. ROM normal. No CVA tenderness. Lungs: clear to auscultation bilaterally Heart: regular rate and rhythm, S1, S2 normal, no murmur, click, rub or gallop Abdomen: soft, non-tender; bowel sounds normal; no masses,  no organomegaly Pulses: 2+ and symmetric Skin: Skin color, texture, turgor normal. No rashes or lesions Lymph nodes: Cervical, supraclavicular, and axillary nodes normal.  Lab Results  Component Value Date   HGBA1C 6.2 04/16/2021   HGBA1C 6.0 (H) 01/10/2020   HGBA1C 5.8 (H) 06/15/2017    Lab Results  Component Value Date   CREATININE 0.73 10/17/2021   CREATININE 0.87 04/16/2021   CREATININE 0.74 01/10/2020    Lab Results  Component Value Date   WBC 5.8 10/17/2021   HGB 13.8 10/17/2021   HCT 41.5 10/17/2021   PLT 285.0 10/17/2021    GLUCOSE 94 10/17/2021   CHOL 245 (H) 10/17/2021   TRIG 85.0 10/17/2021   HDL 89.80 10/17/2021   LDLCALC 138 (H) 10/17/2021   ALT 11 10/17/2021   AST 14 10/17/2021   NA 139 10/17/2021   K 3.8 10/17/2021   CL 101 10/17/2021   CREATININE 0.73 10/17/2021   BUN 14 10/17/2021   CO2 31 10/17/2021   TSH 0.87 10/17/2021   INR 0.8 10/09/2011   HGBA1C 6.2 04/16/2021   MICROALBUR <0.7 10/17/2021    [place] OR NERVE BLOCK-IMAGE ONLY [facility])  Result Date: 08/26/2021 There is no interpretation for this exam.  This order is for images obtained during a surgical procedure.  Please See "Surgeries" Tab for more information regarding the procedure.    Assessment & Plan:   Problem List Items Addressed This Visit     Pre-diabetes  Relevant Orders   Comp Met (CMET) (Completed)   Mixed hyperlipidemia    Her ten year risk remains low  At < 6% even with elevated SBP.  No treatment indicated   Lab Results  Component Value Date   CHOL 245 (H) 10/17/2021   HDL 89.80 10/17/2021   LDLCALC 138 (H) 10/17/2021   TRIG 85.0 10/17/2021   CHOLHDL 3 10/17/2021         Relevant Medications   metoprolol succinate (TOPROL-XL) 25 MG 24 hr tablet   Other Relevant Orders   Lipid panel (Completed)   S/P arthroscopy of right shoulder    Improving passive ROM and resolved pain s/p rotator cuff repair       Chronic intermittent post-traumatic headache    Managed with powdered diclofenac . Refills given      Relevant Medications   gabapentin (NEURONTIN) 300 MG capsule   Diclofenac Potassium,Migraine, (CAMBIA) 50 MG PACK   metoprolol succinate (TOPROL-XL) 25 MG 24 hr tablet   Elevated blood pressure reading    New onset, confirmed with home readings, and NSAIDs eliminated as the cause.  Starting metoprolol given concurrent headaches , anxiety and relatively elevated pulse .  She has requested a cardiac evaluation.  Referral in process       Relevant Orders   Urine Microalbumin w/creat. ratio (Completed)    Comp Met (CMET) (Completed)   Hypercalcemia    Noted on today's labs.  Etiology unclear.  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 stress    Recommend taking melatonin at dinner time and will add trazodoen qhs if needed       Other Visit Diagnoses     Other fatigue    -  Primary   Relevant Orders   TSH (Completed)   CBC with Differential/Platelet (Completed)   Encounter for screening mammogram for malignant neoplasm of breast       Relevant Orders   MM 3D SCREEN BREAST BILATERAL   Palpitations       Relevant Orders   Ambulatory referral to Cardiology   Primary exertional headache       Relevant Medications   gabapentin (NEURONTIN) 300 MG capsule   Diclofenac Potassium,Migraine, (CAMBIA) 50 MG PACK   metoprolol succinate (TOPROL-XL) 25 MG 24 hr tablet       I spent 30 minutes dedicated to the care of this patient on the date of this encounter to include pre-visit review of patient's medical history,  most recent imaging studies, Face-to-face time with the patient , and post visit ordering of testing and therapeutics.    Follow-up: No follow-ups on file.   Crecencio Mc, MD

## 2021-10-18 DIAGNOSIS — Z9889 Other specified postprocedural states: Secondary | ICD-10-CM | POA: Insufficient documentation

## 2021-10-18 DIAGNOSIS — F5102 Adjustment insomnia: Secondary | ICD-10-CM | POA: Insufficient documentation

## 2021-10-18 DIAGNOSIS — R030 Elevated blood-pressure reading, without diagnosis of hypertension: Secondary | ICD-10-CM | POA: Insufficient documentation

## 2021-10-18 DIAGNOSIS — G44329 Chronic post-traumatic headache, not intractable: Secondary | ICD-10-CM | POA: Insufficient documentation

## 2021-10-18 DIAGNOSIS — F341 Dysthymic disorder: Secondary | ICD-10-CM | POA: Insufficient documentation

## 2021-10-18 HISTORY — DX: Hypercalcemia: E83.52

## 2021-10-18 NOTE — Assessment & Plan Note (Signed)
Her ten year risk remains low  At < 6% even with elevated SBP.  No treatment indicated   Lab Results  Component Value Date   CHOL 245 (H) 10/17/2021   HDL 89.80 10/17/2021   LDLCALC 138 (H) 10/17/2021   TRIG 85.0 10/17/2021   CHOLHDL 3 10/17/2021

## 2021-10-18 NOTE — Assessment & Plan Note (Addendum)
New onset, confirmed with home readings, and NSAIDs eliminated as the cause.  Starting metoprolol given concurrent headaches , anxiety and relatively elevated pulse .  She has requested a cardiac evaluation.  Referral in process

## 2021-10-18 NOTE — Assessment & Plan Note (Signed)
Recommend a trial of sertraline to manage emotions during a transiently but prolonged period of stress

## 2021-10-18 NOTE — Assessment & Plan Note (Signed)
Improving passive ROM and resolved pain s/p rotator cuff repair

## 2021-10-18 NOTE — Assessment & Plan Note (Signed)
Noted on today's labs.  Etiology unclear.  Repeat testing advised

## 2021-10-18 NOTE — Assessment & Plan Note (Signed)
Managed with powdered diclofenac . Refills given

## 2021-10-18 NOTE — Assessment & Plan Note (Signed)
Recommend taking melatonin at dinner time and will add trazodoen qhs if needed

## 2021-10-24 ENCOUNTER — Ambulatory Visit (INDEPENDENT_AMBULATORY_CARE_PROVIDER_SITE_OTHER): Payer: Federal, State, Local not specified - PPO | Admitting: Cardiology

## 2021-10-24 ENCOUNTER — Encounter: Payer: Self-pay | Admitting: Cardiology

## 2021-10-24 ENCOUNTER — Other Ambulatory Visit: Payer: Self-pay

## 2021-10-24 VITALS — BP 124/82 | HR 95 | Ht 64.0 in | Wt 132.0 lb

## 2021-10-24 DIAGNOSIS — R030 Elevated blood-pressure reading, without diagnosis of hypertension: Secondary | ICD-10-CM | POA: Diagnosis not present

## 2021-10-24 DIAGNOSIS — E7800 Pure hypercholesterolemia, unspecified: Secondary | ICD-10-CM | POA: Diagnosis not present

## 2021-10-24 DIAGNOSIS — R009 Unspecified abnormalities of heart beat: Secondary | ICD-10-CM

## 2021-10-24 NOTE — Progress Notes (Signed)
Cardiology Office Note:    Date:  10/24/2021   ID:  Kristin Watkins, DOB [birth year], MRN [id number]  PCP:  Crecencio Mc, MD   [facility] Providers Cardiologist:  None     Referring MD: Crecencio Mc, MD   Chief Complaint  Patient presents with   New Patient (Initial Visit)    Referred by PCP for Elevated HR and BP. Meds reviewed verbally with patient.    Kristin Watkins is a 66 y.o. female who is being seen today for the evaluation of elevated heart rates at the request of Derrel Nip Aris Everts, MD.  History of Present Illness:    Ailany Watkins is a 66 y.o. female with a hx of asthma, hyperlipidemia who presents due to elevated heart rates and blood pressure.  Patient is an Clinical cytogeneticist at [employer].  She states being very stressed of late after work.  Usually her blood pressure is well controlled at home and when she is nondistressed.  Has checked her blood pressure couple of times at work and noted systolics in the [address].  Her heart rates baseline usually in the 60s, of late it has decreased to 80s.  She denies palpitations, dizziness, chest pain or shortness of breath.  She states being very active, likes walking in the woods, likes to eat healthy.  Believes a lot of her symptoms is due to stress.  States being able to get her heart rate back to baseline with stress sleep relieving workouts.  Past Medical History:  Diagnosis Date   Allergy    Asthma    Blood transfusion without reported diagnosis    Cancer ([place])    skin ca   Chronic bronchitis (H[facility])    Complication of anesthesia    Dyspnea    GERD (gastroesophageal reflux disease)    Migraines    PONV (postoperative nausea and vomiting)     Past Surgical History:  Procedure Laterality Date   BACK SURGERY  2010   lumbar surgery 1989 and Spinal stimulator in 2010   [place]     C5-T1   ELBOW SURGERY Right 11/2020   ulnar nerve transposition   ESOPHAGOGASTRODUODENOSCOPY  2015   normal    EXCISION/RELEASE BURSA HIP Right 10/2020   Left shoulder surgery      SHOULDER ARTHROSCOPY WITH ROTATOR CUFF REPAIR AND SUBACROMIAL DECOMPRESSION Right 08/26/2021   Procedure: SHOULDER ARTHROSCOPY WITH ROTATOR CUFF REPAIR AND SUBACROMIAL DECOMPRESSION;  Surgeon: Thornton Park, MD;  Location: [facility] ORS;  Service: Orthopedics;  Laterality: Right;    Current Medications: Current Meds  Medication Sig   acetaminophen (TYLENOL) 500 MG tablet Take 500 mg by mouth every 6 (six) hours as needed.   albuterol (ACCUNEB) 0.63 MG/3ML nebulizer solution Take 3 mLs (0.63 mg total) by nebulization every 6 (six) hours as needed for wheezing.   budesonide (PULMICORT) 180 MCG/ACT inhaler Inhale into the lungs 2 (two) times daily.   budesonide-formoterol (SYMBICORT) 160-4.5 MCG/ACT inhaler Inhale 2 puffs into the lungs 2 (two) times daily.   Diclofenac Potassium,Migraine, (CAMBIA) 50 MG PACK TAKE 50 MG BY MOUTH DAILY.   diclofenac Sodium (VOLTAREN) 1 % GEL Apply 4 g topically 4 (four) times daily.   gabapentin (NEURONTIN) 300 MG capsule Take 1 capsule (300 mg total) by mouth 2 (two) times daily. 300-600   metoprolol succinate (TOPROL-XL) 25 MG 24 hr tablet Take 1 tablet (25 mg total) by mouth daily.   Misc Natural Products (IMMUNE TRIO PO) Take by mouth.  montelukast (SINGULAIR) 10 MG tablet TAKE 1 TABLET BY MOUTH EVERY DAY   tiZANidine (ZANAFLEX) 2 MG tablet Take 1 or 2 tabs by mouth TID prn   triamcinolone cream (KENALOG) 0.1 % Apply 1 application topically 2 (two) times daily.     Allergies:   Tape   Social History   Socioeconomic History   Marital status: Married    Spouse name: Not on file   Number of children: Not on file   Years of education: Not on file   Highest education level: Not on file  Occupational History   Not on file  Tobacco Use   Smoking status: Never   Smokeless tobacco: Never  Vaping Use   Vaping Use: Never used  Substance and Sexual Activity   Alcohol use: Yes     Alcohol/week: 1.0 - 2.0 standard drink    Types: 1 - 2 Glasses of wine per week    Comment: occasionally   Drug use: No   Sexual activity: Yes  Other Topics Concern   Not on file  Social History Narrative   Not on file   Social Determinants of Health   Financial Resource Strain: Not on file  Food Insecurity: Not on file  Transportation Needs: Not on file  Physical Activity: Not on file  Stress: Not on file  Social Connections: Not on file     Family History: The patient's family history includes Diabetes in her father; Renal cancer in her father. There is no history of Breast cancer.  ROS:   Please see the history of present illness.     All other systems reviewed and are negative.  EKGs/Labs/Other Studies Reviewed:    The following studies were reviewed today:   EKG:  EKG is  ordered today.  The ekg ordered today demonstrates normal sinus rhythm, heart rate 95  Recent Labs: 10/17/2021: ALT 11; BUN 14; Creatinine, Ser 0.73; Hemoglobin 13.8; Platelets 285.0; Potassium 3.8; Sodium 139; TSH 0.87  Recent Lipid Panel    Component Value Date/Time   CHOL 245 (H) 10/17/2021 1127   CHOL 240 (H) 01/10/2020 1024   TRIG 85.0 10/17/2021 1127   HDL 89.80 10/17/2021 1127   HDL 80 01/10/2020 1024   CHOLHDL 3 10/17/2021 1127   VLDL 17.0 10/17/2021 1127   LDLCALC 138 (H) 10/17/2021 1127   LDLCALC 141 (H) 01/10/2020 1024     Risk Assessment/Calculations:          Physical Exam:    VS:  BP 124/82 (BP Location: Left Arm, Patient Position: Sitting, Cuff Size: Normal)    Pulse 95    Ht 5\' 4"  (1.626 m)    Wt 132 lb (59.9 kg)    SpO2 98%    BMI 22.66 kg/m     Wt Readings from Last 3 Encounters:  10/24/21 132 lb (59.9 kg)  10/17/21 132 lb 9.6 oz (60.1 kg)  08/26/21 127 lb (57.6 kg)     GEN:  Well nourished, well developed in no acute distress HEENT: Normal NECK: No JVD; No carotid bruits LYMPHATICS: No lymphadenopathy CARDIAC: RRR, no murmurs, rubs, gallops RESPIRATORY:   Clear to auscultation without rales, wheezing or rhonchi  ABDOMEN: Soft, non-tender, non-distended MUSCULOSKELETAL:  No edema; No deformity  SKIN: Warm and dry NEUROLOGIC:  Alert and oriented x 3 PSYCHIATRIC:  Normal affect   ASSESSMENT:    1. Elevated heart rate with elevated blood pressure without diagnosis of hypertension   2. Pure hypercholesterolemia    PLAN:  In order of problems listed above:  Elevated heart rate and blood pressure without diagnosis of hypertension.  Heart rates and blood pressure elevations or call in the context of stress/anxiety at work.  BPs at home, in the office today are within normal limits.  Patient advised on stress reducing factors, no indication for medications at this time.  Management of underlying stress will help patient's symptoms. Hyperlipidemia, 10-year ASCVD risk 4.2%.  Patient not in statin benefit group.  Low-cholesterol diet advised.  Follow-up prn       Medication Adjustments/Labs and Tests Ordered: Current medicines are reviewed at length with the patient today.  Concerns regarding medicines are outlined above.  Orders Placed This Encounter  Procedures   EKG 12-Lead   No orders of the defined types were placed in this encounter.   Patient Instructions  Medication Instructions:  Your physician recommends that you continue on your current medications as directed. Please refer to the Current Medication list given to you today.  *If you need a refill on your cardiac medications before your next appointment, please call your pharmacy*   Lab Work: None ordered If you have labs (blood work) drawn today and your tests are completely normal, you will receive your results only by: [place] (if you have MyChart) OR A paper copy in the mail If you have any lab test that is abnormal or we need to change your treatment, we will call you to review the results.   Testing/Procedures: None ordered   Follow-Up: At [facility], you and your health needs are our priority.  As part of our continuing mission to provide you with exceptional heart care, we have created designated Provider Care Teams.  These Care Teams include your primary Cardiologist (physician) and Advanced Practice Providers (APPs -  Physician Assistants and Nurse Practitioners) who all work together to provide you with the care you need, when you need it.  We recommend signing up for the patient portal called "MyChart".  Sign up information is provided on this After Visit Summary.  MyChart is used to connect with patients for Virtual Visits (Telemedicine).  Patients are able to view lab/test results, encounter notes, upcoming appointments, etc.  Non-urgent messages can be sent to your provider as well.   To learn more about what you can do with MyChart, go to [email or web address].    Your next appointment:   Follow up as needed   The format for your next appointment:   In Person  Provider:   You may see Kate Sable, MD or one of the following Advanced Practice Providers on your designated Care Team:   Murray Hodgkins, NP Christell Faith, PA-C Cadence Kathlen Mody, [place]    Other Instructions     Signed, Kate Sable, MD  10/24/2021 12:45 PM    [place]

## 2021-10-24 NOTE — Patient Instructions (Signed)

## 2021-11-12 ENCOUNTER — Ambulatory Visit (INDEPENDENT_AMBULATORY_CARE_PROVIDER_SITE_OTHER): Payer: Federal, State, Local not specified - PPO | Admitting: Internal Medicine

## 2021-11-12 ENCOUNTER — Encounter: Payer: Self-pay | Admitting: Internal Medicine

## 2021-11-12 ENCOUNTER — Other Ambulatory Visit: Payer: Self-pay

## 2021-11-12 DIAGNOSIS — F5102 Adjustment insomnia: Secondary | ICD-10-CM | POA: Diagnosis not present

## 2021-11-12 DIAGNOSIS — R030 Elevated blood-pressure reading, without diagnosis of hypertension: Secondary | ICD-10-CM | POA: Diagnosis not present

## 2021-11-12 DIAGNOSIS — N951 Menopausal and female climacteric states: Secondary | ICD-10-CM

## 2021-11-12 DIAGNOSIS — M5412 Radiculopathy, cervical region: Secondary | ICD-10-CM | POA: Diagnosis not present

## 2021-11-12 LAB — VITAMIN D 25 HYDROXY (VIT D DEFICIENCY, FRACTURES): VITD: 34.98 ng/mL (ref 30.00–100.00)

## 2021-11-12 MED ORDER — CYCLOBENZAPRINE HCL 10 MG PO TABS: 10.0000 mg | ORAL_TABLET | Freq: Three times a day (TID) | ORAL | 0 refills | Status: DC | PRN

## 2021-11-12 MED ORDER — CYCLOBENZAPRINE HCL 10 MG PO TABS: 10.0000 mg | ORAL_TABLET | Freq: Three times a day (TID) | ORAL | 5 refills | Status: DC | PRN

## 2021-11-12 MED ORDER — TRAZODONE HCL 50 MG PO TABS: 25.0000 mg | ORAL_TABLET | Freq: Every evening | ORAL | 3 refills | Status: DC | PRN

## 2021-11-12 NOTE — Assessment & Plan Note (Signed)
She would like to resume the compounded HRT she was receiving in the past (estrogen, progesterone and testosterone) and will contact the compounding pharmacy

## 2021-11-12 NOTE — Assessment & Plan Note (Signed)
S/p cardiology evaluation with reassurance that she does not need medication,  Only better ways to manage her aggravation

## 2021-11-12 NOTE — Patient Instructions (Signed)
Flexeril  and trazodone sent to pharmacy

## 2021-11-12 NOTE — Progress Notes (Signed)
Subjective:  Patient ID: Kristin Watkins, female    DOB: 1956-03-31  Age: 66 y.o. MRN: [id number]  CC: The primary encounter diagnosis was Hypercalcemia. Diagnoses of Insomnia due to psychological stress, Cervical radiculopathy, Elevated blood pressure reading, and Menopause syndrome were also pertinent to this visit.   This visit occurred during the SARS-CoV-2 public health emergency.  Safety protocols were in place, including screening questions prior to the visit, additional usage of staff PPE, and extensive cleaning of exam room while observing appropriate contact time as indicated for disinfecting solutions.    HPI Kristin Watkins presents for  Chief Complaint  Patient presents with   Follow-up    Follow up on calcium level    1) hypercalcemia:  asymptomatic.  First occurrence ,  has not had additional labs  .  Has been taking an excessive amount of calcium and vitamin D to manage BPPV, but not sure how much ,  the D is 1000 Ius daily,  she has since then stopped supplements when the calcium was elevated,but still gets a moderate amount through  dietary sources including  yogurt,  other dairy.  She has chronic pain of bone (spine , multiple surgerieses) and notes that she has had more joint pain since stopping estrogen supplements.  (Was taking testosterone, estrogen and progesterone). Compounded by [facility]   2) saw cardiology  who recommended she not take the  metoprolol .  Home stressors improving soon but  work stressors cited as the cause    Outpatient Medications Prior to Visit  Medication Sig Dispense Refill   acetaminophen (TYLENOL) 500 MG tablet Take 500 mg by mouth every 6 (six) hours as needed.     albuterol (ACCUNEB) 0.63 MG/3ML nebulizer solution Take 3 mLs (0.63 mg total) by nebulization every 6 (six) hours as needed for wheezing. 75 mL 1   budesonide (PULMICORT) 180 MCG/ACT inhaler Inhale into the lungs 2 (two) times daily.     budesonide-formoterol  (SYMBICORT) 160-4.5 MCG/ACT inhaler Inhale 2 puffs into the lungs 2 (two) times daily. 1 each 5   Diclofenac Potassium,Migraine, (CAMBIA) 50 MG PACK TAKE 50 MG BY MOUTH DAILY. 27 each 2   diclofenac Sodium (VOLTAREN) 1 % GEL Apply 4 g topically 4 (four) times daily. 150 g 2   gabapentin (NEURONTIN) 300 MG capsule Take 1 capsule (300 mg total) by mouth 2 (two) times daily. 300-600 180 capsule 3   Misc Natural Products (IMMUNE TRIO PO) Take by mouth.     montelukast (SINGULAIR) 10 MG tablet TAKE 1 TABLET BY MOUTH EVERY DAY 90 tablet 0   triamcinolone cream (KENALOG) 0.1 % Apply 1 application topically 2 (two) times daily. 80 g 0   tiZANidine (ZANAFLEX) 2 MG tablet Take 1 or 2 tabs by mouth TID prn     metoprolol succinate (TOPROL-XL) 25 MG 24 hr tablet Take 1 tablet (25 mg total) by mouth daily. (Patient not taking: Reported on 11/12/2021) 90 tablet 3   No facility-administered medications prior to visit.    Review of Systems;  Patient denies headache, fevers, malaise, unintentional weight loss, skin rash, eye pain, sinus congestion and sinus pain, sore throat, dysphagia,  hemoptysis , cough, dyspnea, wheezing, chest pain, palpitations, orthopnea, edema, abdominal pain, nausea, melena, diarrhea, constipation, flank pain, dysuria, hematuria, urinary  Frequency, nocturia, numbness, tingling, seizures,  Focal weakness, Loss of consciousness,  Tremor, insomnia, depression, anxiety, and suicidal ideation.      Objective:  BP 134/86 (BP Location: Left Arm,  Patient Position: Sitting, Cuff Size: Normal)    Pulse (!) 101    Temp 98.2 F (36.8 C) (Oral)    Ht 5\' 4"  (1.626 m)    Wt 133 lb 9.6 oz (60.6 kg)    SpO2 97%    BMI 22.93 kg/m   BP Readings from Last 3 Encounters:  11/12/21 134/86  10/24/21 124/82  10/17/21 (!) 148/92    Wt Readings from Last 3 Encounters:  11/12/21 133 lb 9.6 oz (60.6 kg)  10/24/21 132 lb (59.9 kg)  10/17/21 132 lb 9.6 oz (60.1 kg)    General appearance: alert,  cooperative and appears stated age Ears: normal TM's and external ear canals both ears Throat: lips, mucosa, and tongue normal; teeth and gums normal Neck: no adenopathy, no carotid bruit, supple, symmetrical, trachea midline and thyroid not enlarged, symmetric, no tenderness/mass/nodules Back: symmetric, no curvature. ROM normal. No CVA tenderness. Lungs: clear to auscultation bilaterally Heart: regular rate and rhythm, S1, S2 normal, no murmur, click, rub or gallop Abdomen: soft, non-tender; bowel sounds normal; no masses,  no organomegaly Pulses: 2+ and symmetric Skin: Skin color, texture, turgor normal. No rashes or lesions Lymph nodes: Cervical, supraclavicular, and axillary nodes normal.  Lab Results  Component Value Date   HGBA1C 6.2 04/16/2021   HGBA1C 6.0 (H) 01/10/2020   HGBA1C 5.8 (H) 06/15/2017    Lab Results  Component Value Date   CREATININE 0.73 10/17/2021   CREATININE 0.87 04/16/2021   CREATININE 0.74 01/10/2020    Lab Results  Component Value Date   WBC 5.8 10/17/2021   HGB 13.8 10/17/2021   HCT 41.5 10/17/2021   PLT 285.0 10/17/2021   GLUCOSE 94 10/17/2021   CHOL 245 (H) 10/17/2021   TRIG 85.0 10/17/2021   HDL 89.80 10/17/2021   LDLCALC 138 (H) 10/17/2021   ALT 11 10/17/2021   AST 14 10/17/2021   NA 139 10/17/2021   K 3.8 10/17/2021   CL 101 10/17/2021   CREATININE 0.73 10/17/2021   BUN 14 10/17/2021   CO2 31 10/17/2021   TSH 0.87 10/17/2021   INR 0.8 10/09/2011   HGBA1C 6.2 04/16/2021   MICROALBUR <0.7 10/17/2021    [place] OR NERVE BLOCK-IMAGE ONLY [facility])  Result Date: 08/26/2021 There is no interpretation for this exam.  This order is for images obtained during a surgical procedure.  Please See "Surgeries" Tab for more information regarding the procedure.    Assessment & Plan:   Problem List Items Addressed This Visit     Cervical radiculopathy    Requesting a different MR for muscle spasm.  Has tolerated flexeril in the past . rx given        Relevant Medications   traZODone (DESYREL) 50 MG tablet   cyclobenzaprine (FLEXERIL) 10 MG tablet   cyclobenzaprine (FLEXERIL) 10 MG tablet   Elevated blood pressure reading    S/p cardiology evaluation with reassurance that she does not need medication,  Only better ways to manage her aggravation       Hypercalcemia - Primary    ddx includes lab error,  Exogenous intake (unlikely), hyperparathyroid, and occult malignancy.  Additional testing required.  She has stopped her calcium supplements.       Relevant Orders   VITAMIN D 25 Hydroxy (Vit-D Deficiency, Fractures)   PTH, Intact and Calcium   PTH-Related Peptide   Insomnia due to psychological stress    Melatonin, meditation,  Deep  Breathing not work. Has used trazodone in the past with good  results .  Repeat trial.       Menopause syndrome    She would like to resume the compounded HRT she was receiving in the past (estrogen, progesterone and testosterone) and will contact the compounding pharmacy       I spent 30 minutes dedicated to the care of this patient on the date of this encounter to include pre-visit review of patient's medical history,  most recent imaging studies, Face-to-face time with the patient , and post visit ordering of testing and therapeutics.    Follow-up: No follow-ups on file.   Crecencio Mc, MD

## 2021-11-12 NOTE — Assessment & Plan Note (Addendum)
Melatonin, meditation,  Deep  Breathing not work. Has used trazodone in the past with good results .  Repeat trial.

## 2021-11-12 NOTE — Assessment & Plan Note (Signed)
Requesting a different MR for muscle spasm.  Has tolerated flexeril in the past . rx given

## 2021-11-12 NOTE — Assessment & Plan Note (Signed)
ddx includes lab error,  Exogenous intake (unlikely), hyperparathyroid, and occult malignancy.  Additional testing required.  She has stopped her calcium supplements.

## 2021-11-13 NOTE — Telephone Encounter (Signed)
Kristin Watkins,  Can you call the compounding pharmacy and ask them to fax [place] a prescription for Ms Falkenstein's previous testosterone gel that they were compounding for her?

## 2021-11-17 NOTE — Telephone Encounter (Signed)
S/w Corene Cornea - Pharmacist at [facility], faxing over renewal script.

## 2021-11-18 ENCOUNTER — Other Ambulatory Visit: Payer: Self-pay | Admitting: Internal Medicine

## 2021-11-18 DIAGNOSIS — N951 Menopausal and female climacteric states: Secondary | ICD-10-CM

## 2021-11-18 LAB — PTH, INTACT AND CALCIUM
Calcium: 9.7 mg/dL (ref 8.6–10.4)
PTH: 28 pg/mL (ref 16–77)

## 2021-11-18 LAB — PTH-RELATED PEPTIDE: PTH-Related Protein (PTH-RP): 7 pg/mL — ABNORMAL LOW (ref 11–20)

## 2021-11-18 NOTE — Assessment & Plan Note (Signed)
Requests to resume topical testosterone compounded by  [facility]  Via faxed prescription Testosterone topical gel:  1 mg/ml   Sig:  Apply 1.46 ml (3 clicks) to skin daily

## 2021-11-25 MED ORDER — CLIMARA PRO 0.045-0.015 MG/DAY TD PTWK: 1.0000 | MEDICATED_PATCH | TRANSDERMAL | 12 refills | Status: DC

## 2021-11-25 NOTE — Addendum Note (Signed)
Addended by: Crecencio Mc on: 11/25/2021 10:08 PM ? ? Modules accepted: Orders ? ?

## 2021-12-04 ENCOUNTER — Other Ambulatory Visit: Payer: Self-pay | Admitting: Internal Medicine

## 2021-12-17 DIAGNOSIS — G5601 Carpal tunnel syndrome, right upper limb: Secondary | ICD-10-CM | POA: Diagnosis not present

## 2021-12-17 DIAGNOSIS — M79641 Pain in right hand: Secondary | ICD-10-CM | POA: Diagnosis not present

## 2021-12-17 DIAGNOSIS — M25531 Pain in right wrist: Secondary | ICD-10-CM | POA: Diagnosis not present

## 2021-12-19 ENCOUNTER — Encounter: Payer: Self-pay | Admitting: Internal Medicine

## 2021-12-22 ENCOUNTER — Encounter: Payer: Self-pay | Admitting: Internal Medicine

## 2021-12-22 ENCOUNTER — Telehealth (INDEPENDENT_AMBULATORY_CARE_PROVIDER_SITE_OTHER): Payer: Federal, State, Local not specified - PPO | Admitting: Internal Medicine

## 2021-12-22 DIAGNOSIS — J45901 Unspecified asthma with (acute) exacerbation: Secondary | ICD-10-CM | POA: Diagnosis not present

## 2021-12-22 MED ORDER — BENZONATATE 200 MG PO CAPS: 200.0000 mg | ORAL_CAPSULE | Freq: Three times a day (TID) | ORAL | 1 refills | Status: DC | PRN

## 2021-12-22 MED ORDER — PREDNISONE 10 MG PO TABS: ORAL_TABLET | ORAL | 0 refills | Status: DC

## 2021-12-22 MED ORDER — HYDROCOD POLI-CHLORPHE POLI ER 10-8 MG/5ML PO SUER: 5.0000 mL | Freq: Every evening | ORAL | 0 refills | Status: DC | PRN

## 2021-12-22 MED ORDER — BUDESONIDE 180 MCG/ACT IN AEPB: 2.0000 | INHALATION_SPRAY | Freq: Two times a day (BID) | RESPIRATORY_TRACT | 11 refills | Status: DC

## 2021-12-22 MED ORDER — AMOXICILLIN-POT CLAVULANATE 875-125 MG PO TABS: 1.0000 | ORAL_TABLET | Freq: Two times a day (BID) | ORAL | 0 refills | Status: DC

## 2021-12-22 NOTE — Patient Instructions (Signed)
I am treating your for sinusitis and an  asthma exacerbation ? ?I am prescribing you a 3 days of 60 mg prednisone  Daily, followed by a 5 day prednisone taper, and tessalon perles for the cough ? ?Tussionex cough syrup (contains hydrocodone)  use at night  ? ?Augmenti twice daily with food for 7 days  ? ? Please take a probiotic ( Align, Floraque or Culturelle),  the generic version of one of these, or eat a serving of good quality yogurt sith live cultures   For a minimum of 3 weeks to prevent a serious antibiotic associated diarrhea  Called clostridium dificile colitis   ?

## 2021-12-22 NOTE — Progress Notes (Signed)
Virtual Visit via caregility Note ? ?This visit type was conducted due to national recommendations for restrictions regarding the COVID-19 pandemic (e.g. social distancing).  This format is felt to be most appropriate for this patient at this time.  All issues noted in this document were discussed and addressed.  No physical exam was performed (except for noted visual exam findings with Video Visits).  ? ?I connected withNAME@ on 12/22/21 at  4:30 PM EDT by a video enabled telemedicine application  and verified that I am speaking with the correct person using two identifiers. ?Location patient: home ?Location provider: work or home office ?Persons participating in the virtual visit: patient, provider ? ?I discussed the limitations, risks, security and privacy concerns of performing an evaluation and management service by telephone and the availability of in person appointments. I also discussed with the patient that there may be a patient responsible charge related to this service. The patient expressed understanding and agreed to proceed. ? ?Reason for visit: asthma/sinusitis  ? ?HPI: ? ? ?2 week history of sneezing,  sinus congestion  sinus pain  accompanied by cough productive of purulent sputum,   post tussive wheezing and burning in chest without pleurisy .  She has a history of asthma and has been using nebulized bronchodilator twice daily,  Singulair, Symbicort  as directed.  Tessalon perles helping with daytime cough  but needs refill and something stronger for nocturnal cough which is keeping her up.   She is not short of breath and pulse ox at home has been > 95%   ? ? ?ROS: See pertinent positives and negatives per HPI. ? ?Past Medical History:  ?Diagnosis Date  ? Allergy   ? Asthma   ? Blood transfusion without reported diagnosis   ? Cancer [facility])   ? skin ca  ? Chronic bronchitis ([place])   ? Complication of anesthesia   ? Dyspnea   ? GERD (gastroesophageal reflux disease)   ? Migraines   ? PONV  (postoperative nausea and vomiting)   ? ? ?Past Surgical History:  ?Procedure Laterality Date  ? BACK SURGERY  2010  ? lumbar surgery 1989 and Spinal stimulator in 2010  ? CERVICAL FUSION    ? C5-T1  ? ELBOW SURGERY Right 11/2020  ? ulnar nerve transposition  ? ESOPHAGOGASTRODUODENOSCOPY  2015  ? normal  ? EXCISION/RELEASE BURSA HIP Right 10/2020  ? Left shoulder surgery     ? SHOULDER ARTHROSCOPY WITH ROTATOR CUFF REPAIR AND SUBACROMIAL DECOMPRESSION Right 08/26/2021  ? Procedure: SHOULDER ARTHROSCOPY WITH ROTATOR CUFF REPAIR AND SUBACROMIAL DECOMPRESSION;  Surgeon: Thornton Park, MD;  Location: [facility] ORS;  Service: Orthopedics;  Laterality: Right;  ? ? ?Family History  ?Problem Relation Age of Onset  ? Diabetes Father   ? Renal cancer Father   ? Breast cancer Neg Hx   ? ? ?SOCIAL HX:  reports that she has never smoked. She has never used smokeless tobacco. She reports current alcohol use of about 1.0 - 2.0 standard drink per week. She reports that she does not use drugs.  ? ? ?Current Outpatient Medications:  ?  acetaminophen (TYLENOL) 500 MG tablet, Take 500 mg by mouth every 6 (six) hours as needed., Disp: , Rfl:  ?  albuterol (ACCUNEB) 0.63 MG/3ML nebulizer solution, Take 3 mLs (0.63 mg total) by nebulization every 6 (six) hours as needed for wheezing., Disp: 75 mL, Rfl: 1 ?  amoxicillin-clavulanate (AUGMENTIN) 875-125 MG tablet, Take 1 tablet by mouth 2 (two) times daily., Disp:  14 tablet, Rfl: 0 ?  benzonatate (TESSALON) 200 MG capsule, Take 1 capsule (200 mg total) by mouth 3 (three) times daily as needed for cough., Disp: 60 capsule, Rfl: 1 ?  chlorpheniramine-HYDROcodone (TUSSIONEX PENNKINETIC ER) 10-8 MG/5ML, Take 5 mLs by mouth at bedtime as needed., Disp: 140 mL, Rfl: 0 ?  Diclofenac Potassium,Migraine, (CAMBIA) 50 MG PACK, TAKE 50 MG BY MOUTH DAILY., Disp: 27 each, Rfl: 2 ?  gabapentin (NEURONTIN) 300 MG capsule, Take 1 capsule (300 mg total) by mouth 2 (two) times daily. 300-600, Disp: 180 capsule,  Rfl: 3 ?  Misc Natural Products (IMMUNE TRIO PO), Take by mouth., Disp: , Rfl:  ?  montelukast (SINGULAIR) 10 MG tablet, TAKE 1 TABLET BY MOUTH EVERY DAY, Disp: 90 tablet, Rfl: 0 ?  predniSONE (DELTASONE) 10 MG tablet, 6 tablets daily for 3 days, then reduce by 1 tablet daily until gone, Disp: 33 tablet, Rfl: 0 ?  traZODone (DESYREL) 50 MG tablet, TAKE 0.5-1 TABLETS BY MOUTH AT BEDTIME AS NEEDED FOR SLEEP., Disp: 90 tablet, Rfl: 2 ?  triamcinolone cream (KENALOG) 0.1 %, Apply 1 application topically 2 (two) times daily., Disp: 80 g, Rfl: 0 ?  budesonide (PULMICORT) 180 MCG/ACT inhaler, Inhale 2 puffs into the lungs 2 (two) times daily., Disp: 1 each, Rfl: 11 ?  budesonide-formoterol (SYMBICORT) 160-4.5 MCG/ACT inhaler, Inhale 2 puffs into the lungs 2 (two) times daily. (Patient not taking: Reported on 12/22/2021), Disp: 1 each, Rfl: 5 ?  cyclobenzaprine (FLEXERIL) 10 MG tablet, Take 1 tablet (10 mg total) by mouth 3 (three) times daily as needed for muscle spasms. (Patient not taking: Reported on 12/22/2021), Disp: 30 tablet, Rfl: 0 ?  diclofenac Sodium (VOLTAREN) 1 % GEL, Apply 4 g topically 4 (four) times daily. (Patient not taking: Reported on 12/22/2021), Disp: 150 g, Rfl: 2 ?  estradiol-levonorgestrel (CLIMARA PRO) 0.045-0.015 MG/DAY, Place 1 patch onto the skin once a week. (Patient not taking: Reported on 12/22/2021), Disp: 4 patch, Rfl: 12 ? ?EXAM: ? ?VITALS per patient if applicable: ? ?GENERAL: alert, oriented, appears sick but not in acute distress ? ?HEENT: atraumatic, conjunttiva clear, no obvious abnormalities on inspection of external nose and ears ? ?NECK: normal movements of the head and neck ? ?LUNGS: on inspection no signs of respiratory distress, breathing rate appears normal, no obvious gross SOB, gasping or wheezing ? ?CV: no obvious cyanosis ? ?MS: moves all visible extremities without noticeable abnormality ? ?PSYCH/NEURO: pleasant and cooperative, no obvious depression or anxiety, speech and thought  processing grossly intact ? ?ASSESSMENT AND PLAN: ? ?Discussed the following assessment and plan: ? ?Asthma exacerbation, mild ? ?Asthma exacerbation, mild ?Prednisone 60 mg daily x 3,  Then begin taper. Continue nebulized bronchodilator prn and continue Symbicort and Singulair.  Adding  augmentin for treatment of sinusitis and tussionex for suppression of nighttime cough ? ?  ?I discussed the assessment and treatment plan with the patient. The patient was provided an opportunity to ask questions and all were answered. The patient agreed with the plan and demonstrated an understanding of the instructions. ?  ?The patient was advised to call back or seek an in-person evaluation if the symptoms worsen or if the condition fails to improve as anticipated. ? ? ?I spent 20 minutes dedicated to the care of this patient on the date of this encounter to include pre-visit review of his medical history,  Face-to-face time with the patient , and post visit ordering of testing and therapeutics.  ? ? ?Helene Kelp  Ether Griffins, MD   ?

## 2021-12-23 DIAGNOSIS — J45901 Unspecified asthma with (acute) exacerbation: Secondary | ICD-10-CM | POA: Insufficient documentation

## 2021-12-23 NOTE — Assessment & Plan Note (Addendum)
Prednisone 60 mg daily x 3,  Then begin taper. Continue nebulized bronchodilator prn and continue Symbicort and Singulair.  Adding  augmentin for treatment of sinusitis and tussionex for suppression of nighttime cough ?

## 2022-01-06 DIAGNOSIS — M75121 Complete rotator cuff tear or rupture of right shoulder, not specified as traumatic: Secondary | ICD-10-CM | POA: Diagnosis not present

## 2022-01-06 DIAGNOSIS — Z9889 Other specified postprocedural states: Secondary | ICD-10-CM | POA: Diagnosis not present

## 2022-01-06 DIAGNOSIS — M25511 Pain in right shoulder: Secondary | ICD-10-CM | POA: Diagnosis not present

## 2022-01-08 ENCOUNTER — Other Ambulatory Visit: Payer: Self-pay | Admitting: Internal Medicine

## 2022-01-08 DIAGNOSIS — G4484 Primary exertional headache: Secondary | ICD-10-CM

## 2022-01-08 NOTE — Telephone Encounter (Signed)
Refilled: 10/17/2021 ?Last OV: 11/12/2021 ?Next OV: 04/16/2022 ?

## 2022-02-10 ENCOUNTER — Other Ambulatory Visit: Payer: Self-pay | Admitting: Internal Medicine

## 2022-02-10 DIAGNOSIS — J309 Allergic rhinitis, unspecified: Secondary | ICD-10-CM

## 2022-02-19 DIAGNOSIS — S92215A Nondisplaced fracture of cuboid bone of left foot, initial encounter for closed fracture: Secondary | ICD-10-CM | POA: Diagnosis not present

## 2022-04-16 ENCOUNTER — Ambulatory Visit: Admitting: Internal Medicine

## 2022-04-30 ENCOUNTER — Other Ambulatory Visit: Payer: Self-pay | Admitting: Internal Medicine

## 2022-04-30 DIAGNOSIS — G4484 Primary exertional headache: Secondary | ICD-10-CM

## 2022-04-30 NOTE — Telephone Encounter (Signed)
Refilled: 01/08/2022  Last OV: 11/12/2021 Next OV: 05/15/2022

## 2022-05-15 ENCOUNTER — Encounter: Payer: Self-pay | Admitting: Internal Medicine

## 2022-05-15 ENCOUNTER — Ambulatory Visit (INDEPENDENT_AMBULATORY_CARE_PROVIDER_SITE_OTHER): Payer: Federal, State, Local not specified - PPO | Admitting: Internal Medicine

## 2022-05-15 VITALS — BP 130/70 | HR 96 | Temp 97.3°F | Resp 14 | Ht 64.0 in | Wt 134.2 lb

## 2022-05-15 DIAGNOSIS — R7303 Prediabetes: Secondary | ICD-10-CM

## 2022-05-15 DIAGNOSIS — R030 Elevated blood-pressure reading, without diagnosis of hypertension: Secondary | ICD-10-CM | POA: Diagnosis not present

## 2022-05-15 DIAGNOSIS — R5383 Other fatigue: Secondary | ICD-10-CM

## 2022-05-15 DIAGNOSIS — F5102 Adjustment insomnia: Secondary | ICD-10-CM

## 2022-05-15 DIAGNOSIS — M5136 Other intervertebral disc degeneration, lumbar region: Secondary | ICD-10-CM

## 2022-05-15 DIAGNOSIS — Z780 Asymptomatic menopausal state: Secondary | ICD-10-CM

## 2022-05-15 DIAGNOSIS — Z8781 Personal history of (healed) traumatic fracture: Secondary | ICD-10-CM

## 2022-05-15 DIAGNOSIS — E782 Mixed hyperlipidemia: Secondary | ICD-10-CM | POA: Diagnosis not present

## 2022-05-15 DIAGNOSIS — J4520 Mild intermittent asthma, uncomplicated: Secondary | ICD-10-CM

## 2022-05-15 LAB — COMPREHENSIVE METABOLIC PANEL
ALT: 9 U/L (ref 0–35)
AST: 13 U/L (ref 0–37)
Albumin: 4.5 g/dL (ref 3.5–5.2)
Alkaline Phosphatase: 62 U/L (ref 39–117)
BUN: 18 mg/dL (ref 6–23)
CO2: 30 mEq/L (ref 19–32)
Calcium: 9.5 mg/dL (ref 8.4–10.5)
Chloride: 100 mEq/L (ref 96–112)
Creatinine, Ser: 0.87 mg/dL (ref 0.40–1.20)
GFR: 70.2 mL/min (ref >60.00)
Glucose, Bld: 75 mg/dL (ref 70–99)
Potassium: 4.1 mEq/L (ref 3.5–5.1)
Sodium: 139 mEq/L (ref 135–145)
Total Bilirubin: 0.6 mg/dL (ref 0.2–1.2)
Total Protein: 6.7 g/dL (ref 6.0–8.3)

## 2022-05-15 LAB — CBC WITH DIFFERENTIAL/PLATELET
Basophils Absolute: 0 10*3/uL (ref 0.0–0.1)
Basophils Relative: 0.8 % (ref 0.0–3.0)
Eosinophils Absolute: 0.3 10*3/uL (ref 0.0–0.7)
Eosinophils Relative: 5.1 % — ABNORMAL HIGH (ref 0.0–5.0)
HCT: 38.9 % (ref 36.0–46.0)
Hemoglobin: 13 g/dL (ref 12.0–15.0)
Lymphocytes Relative: 28.1 % (ref 12.0–46.0)
Lymphs Abs: 1.5 10*3/uL (ref 0.7–4.0)
MCHC: 33.3 g/dL (ref 30.0–36.0)
MCV: 86.2 fl (ref 78.0–100.0)
Monocytes Absolute: 0.4 10*3/uL (ref 0.1–1.0)
Monocytes Relative: 7.7 % (ref 3.0–12.0)
Neutro Abs: 3.1 10*3/uL (ref 1.4–7.7)
Neutrophils Relative %: 58.3 % (ref 43.0–77.0)
Platelets: 228 10*3/uL (ref 150.0–400.0)
RBC: 4.51 Mil/uL (ref 3.87–5.11)
RDW: 13.9 % (ref 11.5–15.5)
WBC: 5.3 10*3/uL (ref 4.0–10.5)

## 2022-05-15 LAB — VITAMIN D 25 HYDROXY (VIT D DEFICIENCY, FRACTURES): VITD: 33.77 ng/mL (ref 30.00–100.00)

## 2022-05-15 LAB — TSH: TSH: 1.29 u[IU]/mL (ref 0.35–5.50)

## 2022-05-15 MED ORDER — CYCLOBENZAPRINE HCL 10 MG PO TABS: 10.0000 mg | ORAL_TABLET | Freq: Three times a day (TID) | ORAL | 3 refills | Status: DC | PRN

## 2022-05-15 MED ORDER — GABAPENTIN 300 MG PO CAPS: 300.0000 mg | ORAL_CAPSULE | Freq: Two times a day (BID) | ORAL | 3 refills | Status: DC

## 2022-05-15 NOTE — Assessment & Plan Note (Signed)
No treatment is needed .  her ten year risk remains low  At < 6% even with elevated SBP.  Lab Results  Component Value Date   CHOL 245 (H) 10/17/2021   HDL 89.80 10/17/2021   LDLCALC 138 (H) 10/17/2021   TRIG 85.0 10/17/2021   CHOLHDL 3 10/17/2021

## 2022-05-15 NOTE — Progress Notes (Signed)
Subjective:  Patient ID: Kristin Watkins, female    DOB: May 05, 1956  Age: 65 y.o. MRN: [id number]  CC: The primary encounter diagnosis was Postmenopausal estrogen deficiency. Diagnoses of Hypercalcemia, Mixed hyperlipidemia, Elevated blood pressure reading, Pre-diabetes, Other fatigue, History of fracture of foot, History of fracture of clavicle, and Insomnia due to psychological stress were also pertinent to this visit.   HPI Kristin Watkins presents for  Chief Complaint  Patient presents with   Follow-up    6 mon, denies any concerns or unusual pain besides her cervical/lumbar spine which are fused.   1) Chronic neck and back pain:  she continues to use gabapentin at night.  Flexeril for muscle spasms , meloxicam and tylenol as needed.  Acupuncture, PT and chiropractic manipulation.    2)  Hypercalcermia:  discussed previous cause as due to over supplementation.  Currently using only dietary sources which are likely inadequate .    3) history of elevated blood pressures: home readings have been 120/70 or less,  slightly higher with increased salt intake   Outpatient Medications Prior to Visit  Medication Sig Dispense Refill   acetaminophen (TYLENOL) 500 MG tablet Take 500 mg by mouth every 6 (six) hours as needed.     albuterol (ACCUNEB) 0.63 MG/3ML nebulizer solution Take 3 mLs (0.63 mg total) by nebulization every 6 (six) hours as needed for wheezing. 75 mL 1   budesonide (PULMICORT) 180 MCG/ACT inhaler Inhale 2 puffs into the lungs 2 (two) times daily. 1 each 11   budesonide-formoterol (SYMBICORT) 160-4.5 MCG/ACT inhaler Inhale 2 puffs into the lungs 2 (two) times daily. 1 each 5   Diclofenac Potassium,Migraine, 50 MG PACK TAKE 50 MG BY MOUTH DAILY. 27 each 2   diclofenac Sodium (VOLTAREN) 1 % GEL Apply 4 g topically 4 (four) times daily. 150 g 2   Misc Natural Products (IMMUNE TRIO PO) Take by mouth.     traZODone (DESYREL) 50 MG tablet TAKE 0.5-1 TABLETS BY MOUTH AT BEDTIME  AS NEEDED FOR SLEEP. 90 tablet 2   triamcinolone cream (KENALOG) 0.1 % Apply 1 application topically 2 (two) times daily. 80 g 0   cyclobenzaprine (FLEXERIL) 10 MG tablet Take 1 tablet (10 mg total) by mouth 3 (three) times daily as needed for muscle spasms. 30 tablet 0   gabapentin (NEURONTIN) 300 MG capsule Take 1 capsule (300 mg total) by mouth 2 (two) times daily. 300-600 180 capsule 3   amoxicillin-clavulanate (AUGMENTIN) 875-125 MG tablet Take 1 tablet by mouth 2 (two) times daily. (Patient not taking: Reported on 05/15/2022) 14 tablet 0   benzonatate (TESSALON) 200 MG capsule Take 1 capsule (200 mg total) by mouth 3 (three) times daily as needed for cough. (Patient not taking: Reported on 05/15/2022) 60 capsule 1   chlorpheniramine-HYDROcodone (TUSSIONEX PENNKINETIC ER) 10-8 MG/5ML Take 5 mLs by mouth at bedtime as needed. (Patient not taking: Reported on 05/15/2022) 140 mL 0   estradiol-levonorgestrel (CLIMARA PRO) 0.045-0.015 MG/DAY Place 1 patch onto the skin once a week. (Patient not taking: Reported on 12/22/2021) 4 patch 12   montelukast (SINGULAIR) 10 MG tablet TAKE 1 TABLET BY MOUTH EVERY DAY (Patient not taking: Reported on 05/15/2022) 90 tablet 3   predniSONE (DELTASONE) 10 MG tablet 6 tablets daily for 3 days, then reduce by 1 tablet daily until gone (Patient not taking: Reported on 05/15/2022) 33 tablet 0   No facility-administered medications prior to visit.    Review of Systems;  Patient denies headache, fevers, malaise,  unintentional weight loss, skin rash, eye pain, sinus congestion and sinus pain, sore throat, dysphagia,  hemoptysis , cough, dyspnea, wheezing, chest pain, palpitations, orthopnea, edema, abdominal pain, nausea, melena, diarrhea, constipation, flank pain, dysuria, hematuria, urinary  Frequency, nocturia, numbness, tingling, seizures,  Focal weakness, Loss of consciousness,  Tremor, insomnia, depression, anxiety, and suicidal ideation.      Objective:  BP 130/70  (BP Location: Left Arm, Patient Position: Sitting, Cuff Size: Normal)   Pulse 96   Temp (!) 97.3 F (36.3 C) (Oral)   Resp 14   Ht 5' 4"  (1.626 m)   Wt 134 lb 3.2 oz (60.9 kg)   SpO2 98%   BMI 23.04 kg/m   BP Readings from Last 3 Encounters:  05/15/22 130/70  11/12/21 134/86  10/24/21 124/82    Wt Readings from Last 3 Encounters:  05/15/22 134 lb 3.2 oz (60.9 kg)  12/22/21 133 lb 9.6 oz (60.6 kg)  11/12/21 133 lb 9.6 oz (60.6 kg)    General appearance: alert, cooperative and appears stated age Ears: normal TM's and external ear canals both ears Throat: lips, mucosa, and tongue normal; teeth and gums normal Neck: no adenopathy, no carotid bruit, supple, symmetrical, trachea midline and thyroid not enlarged, symmetric, no tenderness/mass/nodules Back: symmetric, no curvature. ROM normal. No CVA tenderness. Lungs: clear to auscultation bilaterally Heart: regular rate and rhythm, S1, S2 normal, no murmur, click, rub or gallop Abdomen: soft, non-tender; bowel sounds normal; no masses,  no organomegaly Pulses: 2+ and symmetric Skin: Skin color, texture, turgor normal. No rashes or lesions Lymph nodes: Cervical, supraclavicular, and axillary nodes normal.  Lab Results  Component Value Date   HGBA1C 6.2 04/16/2021   HGBA1C 6.0 (H) 01/10/2020   HGBA1C 5.8 (H) 06/15/2017    Lab Results  Component Value Date   CREATININE 0.73 10/17/2021   CREATININE 0.87 04/16/2021   CREATININE 0.74 01/10/2020    Lab Results  Component Value Date   WBC 5.8 10/17/2021   HGB 13.8 10/17/2021   HCT 41.5 10/17/2021   PLT 285.0 10/17/2021   GLUCOSE 94 10/17/2021   CHOL 245 (H) 10/17/2021   TRIG 85.0 10/17/2021   HDL 89.80 10/17/2021   LDLCALC 138 (H) 10/17/2021   ALT 11 10/17/2021   AST 14 10/17/2021   NA 139 10/17/2021   K 3.8 10/17/2021   CL 101 10/17/2021   CREATININE 0.73 10/17/2021   BUN 14 10/17/2021   CO2 31 10/17/2021   TSH 0.87 10/17/2021   INR 0.8 10/09/2011   HGBA1C 6.2  04/16/2021   MICROALBUR <0.7 10/17/2021    [place] OR NERVE BLOCK-IMAGE ONLY [facility])  Result Date: 08/26/2021 There is no interpretation for this exam.  This order is for images obtained during a surgical procedure.  Please See "Surgeries" Tab for more information regarding the procedure.    Assessment & Plan:   Problem List Items Addressed This Visit     Elevated blood pressure reading   Relevant Orders   Comp Met (CMET)   Hypercalcemia    Secondary to excessive supplementation.  PTH, PTH-RP and ionized calcium were normal. Discussed ways to increase her dietary calcium.  Rechecking PTH today       Relevant Orders   VITAMIN D 25 Hydroxy (Vit-D Deficiency, Fractures)   Insomnia due to psychological stress    Improved with use of trazodone. Using 1/2 tablet. No changes today       Mixed hyperlipidemia    No treatment is needed .  her  ten year risk remains low  At < 6% even with elevated SBP.  Lab Results  Component Value Date   CHOL 245 (H) 10/17/2021   HDL 89.80 10/17/2021   LDLCALC 138 (H) 10/17/2021   TRIG 85.0 10/17/2021   CHOLHDL 3 10/17/2021         Pre-diabetes   Relevant Orders   Comp Met (CMET)   Other Visit Diagnoses     Postmenopausal estrogen deficiency    -  Primary   Relevant Orders   DG Bone Density   Other fatigue       Relevant Orders   CBC with Differential/Platelet   TSH   History of fracture of foot       Relevant Orders   DG Bone Density   History of fracture of clavicle       Relevant Orders   DG Bone Density       I spent a total of   27  minutes with this patient in a face to face visit on the date of this encounter reviewing the last office visit with me in May,  her most evaluation by cardiology  ,  patient's diet and eating habits, home blood pressure readings ,  most recent imaging study ,   and post visit ordering of testing and therapeutics.    Follow-up: No follow-ups on file.   Crecencio Mc, MD

## 2022-05-15 NOTE — Patient Instructions (Addendum)
  Your annual mammogram AND  DEXA  SCAN have been ordered.  Please call [facility] to call to make your appointments  .  The phone number for [facility] is  [phone]   DO NOT DO ON SAME DAY UNLESS [facility] IS COVERING ) BLUE CROSS WILL NOT COVER)

## 2022-05-15 NOTE — Assessment & Plan Note (Addendum)
Secondary to excessive supplementation.  PTH, PTH-RP and ionized calcium were normal. Discussed ways to increase her dietary calcium.  Rechecking PTH today

## 2022-05-15 NOTE — Assessment & Plan Note (Addendum)
Trazodone has improved  quality of sleep .  And back pain is currently managed  with gabapentin, meloxicam, flexeril  and tylenol

## 2022-05-15 NOTE — Assessment & Plan Note (Signed)
Using pulmicort every other day with no recent exacerbations or need for albuterol.  She stopped singulair

## 2022-05-15 NOTE — Assessment & Plan Note (Signed)
Improved with use of trazodone. Using 1/2 tablet. No changes today

## 2022-05-15 NOTE — Assessment & Plan Note (Signed)
Checking bone density given post menopausal status.

## 2022-05-28 DIAGNOSIS — M25531 Pain in right wrist: Secondary | ICD-10-CM | POA: Diagnosis not present

## 2022-05-28 DIAGNOSIS — G5601 Carpal tunnel syndrome, right upper limb: Secondary | ICD-10-CM | POA: Diagnosis not present

## 2022-06-24 ENCOUNTER — Encounter: Payer: Self-pay | Admitting: Internal Medicine

## 2022-06-24 ENCOUNTER — Ambulatory Visit
Admission: RE | Admit: 2022-06-24 | Discharge: 2022-06-24 | Disposition: A | Discharge status: Home / Self Care | Payer: Federal, State, Local not specified - PPO | Source: Ambulatory Visit | Attending: Internal Medicine | Admitting: Internal Medicine

## 2022-06-24 DIAGNOSIS — Z780 Asymptomatic menopausal state: Secondary | ICD-10-CM | POA: Insufficient documentation

## 2022-06-24 DIAGNOSIS — Z8781 Personal history of (healed) traumatic fracture: Secondary | ICD-10-CM | POA: Diagnosis not present

## 2022-06-24 DIAGNOSIS — M85852 Other specified disorders of bone density and structure, left thigh: Secondary | ICD-10-CM | POA: Insufficient documentation

## 2022-06-24 DIAGNOSIS — M8589 Other specified disorders of bone density and structure, multiple sites: Secondary | ICD-10-CM | POA: Diagnosis not present

## 2022-06-27 DIAGNOSIS — M85859 Other specified disorders of bone density and structure, unspecified thigh: Secondary | ICD-10-CM | POA: Insufficient documentation

## 2022-07-02 ENCOUNTER — Ambulatory Visit
Admission: RE | Admit: 2022-07-02 | Discharge: 2022-07-02 | Disposition: A | Discharge status: Home / Self Care | Payer: Federal, State, Local not specified - PPO | Source: Ambulatory Visit | Attending: Internal Medicine | Admitting: Internal Medicine

## 2022-07-02 DIAGNOSIS — Z1231 Encounter for screening mammogram for malignant neoplasm of breast: Secondary | ICD-10-CM | POA: Insufficient documentation

## 2022-07-21 DIAGNOSIS — Z23 Encounter for immunization: Secondary | ICD-10-CM | POA: Diagnosis not present

## 2022-08-04 ENCOUNTER — Encounter: Payer: Self-pay | Admitting: Internal Medicine

## 2022-08-07 MED ORDER — GABAPENTIN 100 MG PO CAPS: 100.0000 mg | ORAL_CAPSULE | Freq: Three times a day (TID) | ORAL | 5 refills | Status: DC

## 2022-09-19 ENCOUNTER — Other Ambulatory Visit: Payer: Self-pay | Admitting: Internal Medicine

## 2022-10-11 ENCOUNTER — Other Ambulatory Visit: Payer: Self-pay | Admitting: Internal Medicine

## 2022-10-23 ENCOUNTER — Encounter: Payer: Self-pay | Admitting: Internal Medicine

## 2022-10-23 ENCOUNTER — Ambulatory Visit: Payer: Federal, State, Local not specified - PPO | Admitting: Internal Medicine

## 2022-10-23 VITALS — BP 132/88 | HR 101 | Temp 97.7°F | Ht 64.0 in | Wt 140.4 lb

## 2022-10-23 DIAGNOSIS — J0100 Acute maxillary sinusitis, unspecified: Secondary | ICD-10-CM

## 2022-10-23 DIAGNOSIS — J329 Chronic sinusitis, unspecified: Secondary | ICD-10-CM | POA: Insufficient documentation

## 2022-10-23 MED ORDER — AMOXICILLIN-POT CLAVULANATE 875-125 MG PO TABS: 1.0000 | ORAL_TABLET | Freq: Two times a day (BID) | ORAL | 0 refills | Status: DC

## 2022-10-23 MED ORDER — PREDNISONE 10 MG PO TABS: ORAL_TABLET | ORAL | 0 refills | Status: DC

## 2022-10-23 MED ORDER — CHERATUSSIN AC 100-10 MG/5ML PO SOLN
5.0000 mL | Freq: Three times a day (TID) | ORAL | 0 refills | Status: DC | PRN
Start: 2022-10-23 — End: 2023-02-20

## 2022-10-23 MED ORDER — BUDESONIDE 180 MCG/ACT IN AEPB: 2.0000 | INHALATION_SPRAY | Freq: Two times a day (BID) | RESPIRATORY_TRACT | 11 refills | Status: DC

## 2022-10-23 NOTE — Assessment & Plan Note (Addendum)
SYMPTOMS PRESENT FOR 3 WEEKS.  AUGMENTIN X 14 DAYS,  6 DAY PREDNSISONE TAPER.  CONTINUE AFRIN PRN  ADDING CHERATUSSIN FOR NIGHTTIME COUGH Daily use of a probiotic advised for 3 weeks.

## 2022-10-23 NOTE — Progress Notes (Signed)
Subjective:  Patient ID: Kristin Watkins, female    DOB: 01/25/1956  Age: 67 y.o. MRN: [id number]  CC: The encounter diagnosis was Acute non-recurrent maxillary sinusitis.   HPI [name] presents for  Chief Complaint  Patient presents with   Sinus Problem    Facial pain, headache, dizziness, dry cough, sinus drainage. Neg covid x several times. Symptoms been going on since 09/30/2022.   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  FACIAL PAIN , MALAISE  . COUGH WORSE AT NIGHT DUE TO PND.  USING BENONOTATE DURING THE DAY    -, Outpatient Medications Prior to Visit  Medication Sig Dispense Refill   acetaminophen (TYLENOL) 500 MG tablet Take 500 mg by mouth every 6 (six) hours as needed.     albuterol (ACCUNEB) 0.63 MG/3ML nebulizer solution Take 3 mLs (0.63 mg total) by nebulization every 6 (six) hours as needed for wheezing. 75 mL 1   cyclobenzaprine (FLEXERIL) 10 MG tablet TAKE 1 TABLET BY MOUTH THREE TIMES A DAY AS NEEDED FOR MUSCLE SPASMS 90 tablet 3   Diclofenac Potassium,Migraine, 50 MG PACK TAKE 50 MG BY MOUTH DAILY. 27 each 2   diclofenac Sodium (VOLTAREN) 1 % GEL Apply 4 g topically 4 (four) times daily. 150 g 2   gabapentin (NEURONTIN) 100 MG capsule Take 1 capsule (100 mg total) by mouth 3 (three) times daily. 90 capsule 5   Misc Natural Products (IMMUNE TRIO PO) Take by mouth.     traZODone (DESYREL) 50 MG tablet TAKE 1/2 TO 1 TABLET BY MOUTH AT BEDTIME AS NEEDED FOR SLEEP 90 tablet 2   triamcinolone cream (KENALOG) 0.1 % Apply 1 application topically 2 (two) times daily. 80 g 0   budesonide (PULMICORT) 180 MCG/ACT inhaler Inhale 2 puffs into the lungs 2 (two) times daily. 1 each 11   budesonide-formoterol (SYMBICORT) 160-4.5 MCG/ACT inhaler Inhale 2 puffs into the lungs 2 (two) times daily. (Patient not taking: Reported on 10/23/2022) 1 each 5   No  facility-administered medications prior to visit.    Review of Systems;  Patient denies headache, fevers, malaise, unintentional weight loss, skin rash, eye pain, sinus congestion and sinus pain, sore throat, dysphagia,  hemoptysis , cough, dyspnea, wheezing, chest pain, palpitations, orthopnea, edema, abdominal pain, nausea, melena, diarrhea, constipation, flank pain, dysuria, hematuria, urinary  Frequency, nocturia, numbness, tingling, seizures,  Focal weakness, Loss of consciousness,  Tremor, insomnia, depression, anxiety, and suicidal ideation.      Objective:  BP 132/88   Pulse (!) 101   Temp 97.7 F (36.5 C) (Oral)   Ht '5\' 4"'$  (1.626 m)   Wt 140 lb 6.4 oz (63.7 kg)   SpO2 98%   BMI 24.10 kg/m   BP Readings from Last 3 Encounters:  10/23/22 132/88  05/15/22 130/70  11/12/21 134/86    Wt Readings from Last 3 Encounters:  10/23/22 140 lb 6.4 oz (63.7 kg)  05/15/22 134 lb 3.2 oz (60.9 kg)  12/22/21 133 lb 9.6 oz (60.6 kg)    Physical Exam Vitals reviewed.  Constitutional:      General: She is not in acute distress.    Appearance: Normal appearance. She is normal weight. She is not ill-appearing, toxic-appearing or diaphoretic.  HENT:     Head: Normocephalic.     Right Ear: Tympanic membrane normal.     Left Ear: Tympanic membrane normal.  Nose: Congestion and rhinorrhea present.     Mouth/Throat:     Mouth: Mucous membranes are moist.  Eyes:     General: No scleral icterus.       Right eye: No discharge.        Left eye: No discharge.     Conjunctiva/sclera: Conjunctivae normal.     Pupils: Pupils are equal, round, and reactive to light.  Cardiovascular:     Rate and Rhythm: Normal rate and regular rhythm.  Pulmonary:     Effort: Pulmonary effort is normal.     Breath sounds: Normal breath sounds. No wheezing or rales.  Musculoskeletal:        General: Normal range of motion.     Cervical back: Normal range of motion and neck supple.  Lymphadenopathy:      Cervical: Cervical adenopathy present.  Skin:    General: Skin is warm and dry.  Neurological:     General: No focal deficit present.     Mental Status: She is alert and oriented to person, place, and time. Mental status is at baseline.  Psychiatric:        Mood and Affect: Mood normal.        Behavior: Behavior normal.        Thought Content: Thought content normal.        Judgment: Judgment normal.     Lab Results  Component Value Date   HGBA1C 6.2 04/16/2021   HGBA1C 6.0 (H) 01/10/2020   HGBA1C 5.8 (H) 06/15/2017    Lab Results  Component Value Date   CREATININE 0.87 05/15/2022   CREATININE 0.73 10/17/2021   CREATININE 0.87 04/16/2021    Lab Results  Component Value Date   WBC 5.3 05/15/2022   HGB 13.0 05/15/2022   HCT 38.9 05/15/2022   PLT 228.0 05/15/2022   GLUCOSE 75 05/15/2022   CHOL 245 (H) 10/17/2021   TRIG 85.0 10/17/2021   HDL 89.80 10/17/2021   LDLCALC 138 (H) 10/17/2021   ALT 9 05/15/2022   AST 13 05/15/2022   NA 139 05/15/2022   K 4.1 05/15/2022   CL 100 05/15/2022   CREATININE 0.87 05/15/2022   BUN 18 05/15/2022   CO2 30 05/15/2022   TSH 1.29 05/15/2022   INR 0.8 10/09/2011   HGBA1C 6.2 04/16/2021   MICROALBUR <0.7 10/17/2021    MM 3D SCREEN BREAST BILATERAL  Result Date: 07/02/2022 CLINICAL DATA:  Screening. EXAM: DIGITAL SCREENING BILATERAL MAMMOGRAM WITH TOMOSYNTHESIS AND CAD TECHNIQUE: Bilateral screening digital craniocaudal and mediolateral oblique mammograms were obtained. Bilateral screening digital breast tomosynthesis was performed. The images were evaluated with computer-aided detection. COMPARISON:  Previous exam(s). ACR Breast Density Category c: The breast tissue is heterogeneously dense, which may obscure small masses. FINDINGS: There are no findings suspicious for malignancy. IMPRESSION: No mammographic evidence of malignancy. A result letter of this screening mammogram will be mailed directly to the patient. RECOMMENDATION:  Screening mammogram in one year. (Code:SM-B-01Y) BI-RADS CATEGORY  1: Negative. Electronically Signed   By: Ileana Roup M.D.   On: 07/02/2022 17:23    Assessment & Plan:  .Acute non-recurrent maxillary sinusitis Assessment & Plan: SYMPTOMS PRESENT FOR 3 WEEKS.  AUGMENTIN X 14 DAYS,  6 DAY PREDNSISONE TAPER.  CONTINUE AFRIN PRN  ADDING CHERATUSSIN FOR NIGHTTIME COUGH Daily use of a probiotic advised for 3 weeks.     Other orders -     predniSONE; 6 tablets on Day 1 , then reduce by 1 tablet daily until  gone  Dispense: 21 tablet; Refill: 0 -     Cheratussin AC; Take 5 mLs by mouth 3 (three) times daily as needed for cough.  Dispense: 120 mL; Refill: 0 -     Budesonide; Inhale 2 puffs into the lungs 2 (two) times daily.  Dispense: 1 each; Refill: 11 -     Amoxicillin-Pot Clavulanate; Take 1 tablet by mouth 2 (two) times daily.  Dispense: 28 tablet; Refill: 0     Follow-up: No follow-ups on file.   Crecencio Mc, MD

## 2022-10-28 ENCOUNTER — Encounter: Payer: Self-pay | Admitting: Internal Medicine

## 2022-10-29 MED ORDER — AMOXICILLIN-POT CLAVULANATE 875-125 MG PO TABS: 1.0000 | ORAL_TABLET | Freq: Two times a day (BID) | ORAL | 0 refills | Status: DC

## 2022-11-02 ENCOUNTER — Ambulatory Visit: Payer: Federal, State, Local not specified - PPO | Admitting: Internal Medicine

## 2022-11-26 DIAGNOSIS — D485 Neoplasm of uncertain behavior of skin: Secondary | ICD-10-CM | POA: Diagnosis not present

## 2022-11-26 DIAGNOSIS — Z872 Personal history of diseases of the skin and subcutaneous tissue: Secondary | ICD-10-CM | POA: Diagnosis not present

## 2022-11-26 DIAGNOSIS — Z85828 Personal history of other malignant neoplasm of skin: Secondary | ICD-10-CM | POA: Diagnosis not present

## 2022-11-26 DIAGNOSIS — L578 Other skin changes due to chronic exposure to nonionizing radiation: Secondary | ICD-10-CM | POA: Diagnosis not present

## 2022-11-26 DIAGNOSIS — Z86018 Personal history of other benign neoplasm: Secondary | ICD-10-CM | POA: Diagnosis not present

## 2022-12-05 IMAGING — RF DG FLUORO GUIDE NDL PLC/BX
2 series · 7 of 7 positions shown · non-contrast
Comparison: none

CLINICAL DATA: Right shoulder pain and limited range of motion
since [REDACTED] after painting overhead, patient also plays pickle ball
and complains of episodes of subluxation. Patient denies any
previous right shoulder surgeries.

EXAM:
EXAM
RIGHT SHOULDER INJECTION UNDER FLUOROSCOPY FOR CT
FLUOROSCOPY TIME:  0.1 minute, 0.30 mGy, 2 images
TECHNIQUE: Risks and benefits of the procedure were discussed with the patient,
all the patient's questions were answered and informed written
consent was obtained.

[Series 1: cp_standard · 0.19mm/px · 3 of 3 frames shown (1 of 2)]
[frame 1/3]
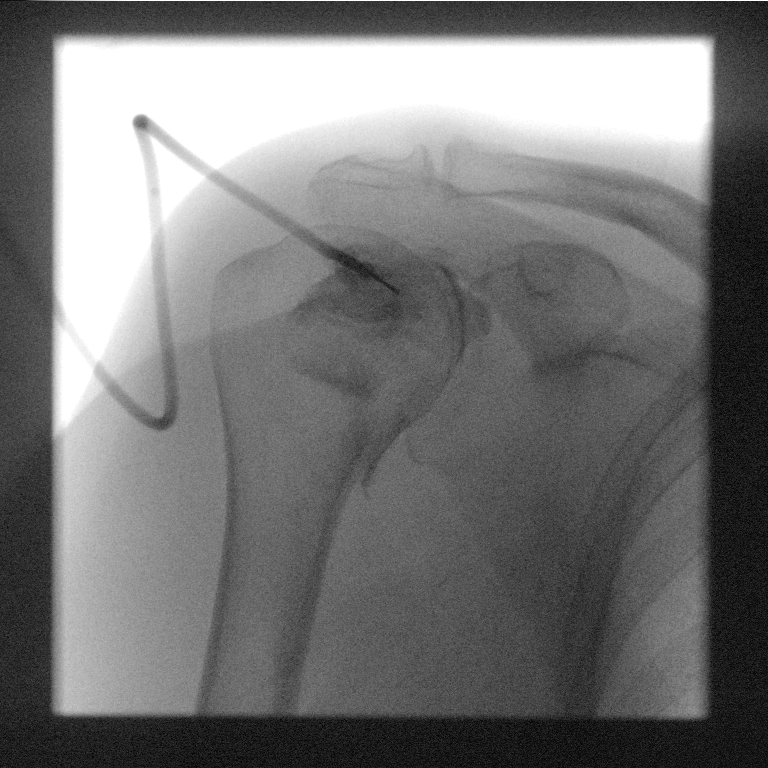
[frame 2/3]
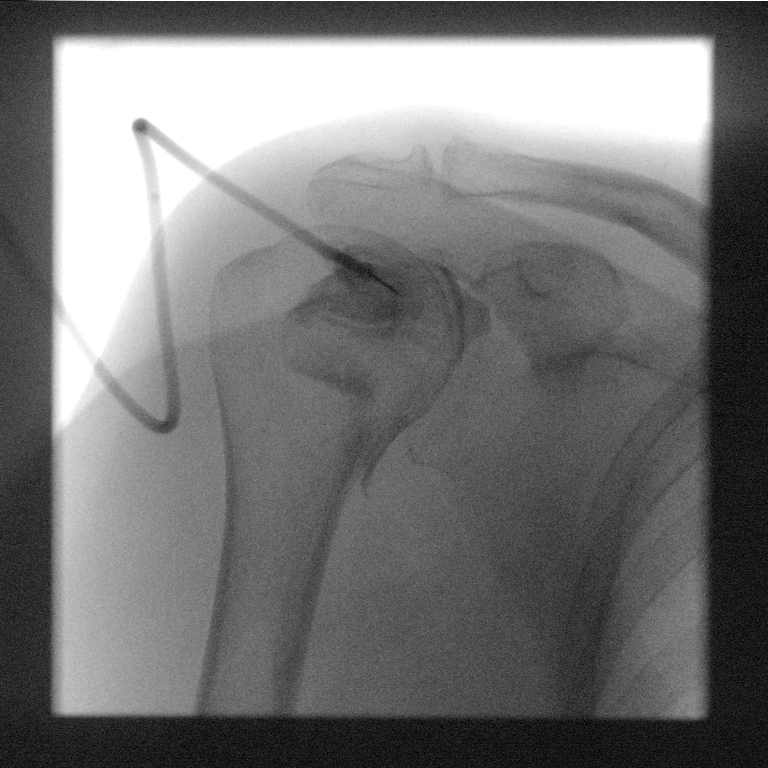
[frame 3/3]
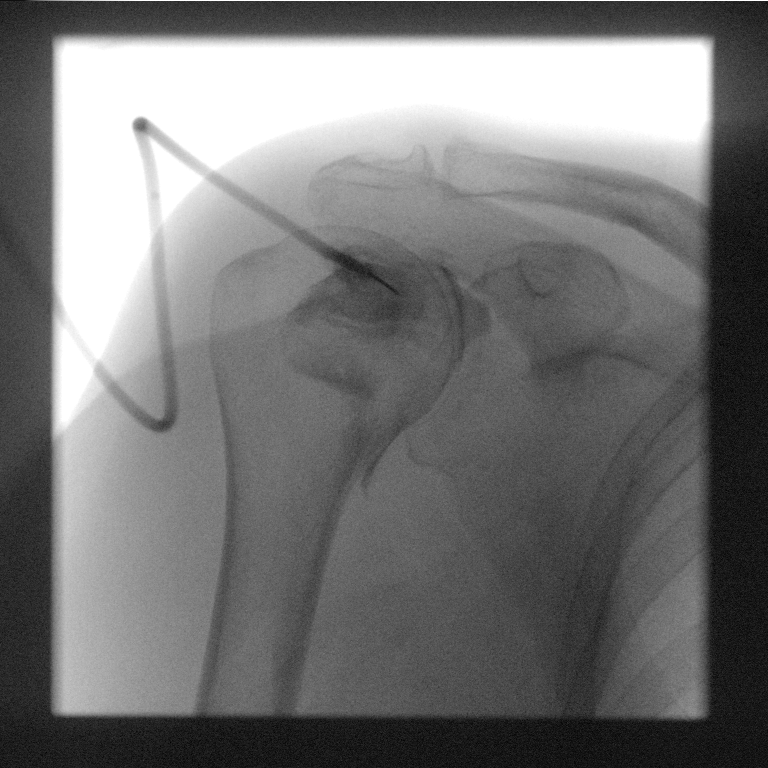

[Series 2: cp_standard · 0.19mm/px · 4 of 4 frames shown (2 of 2)]
[frame 1/4]
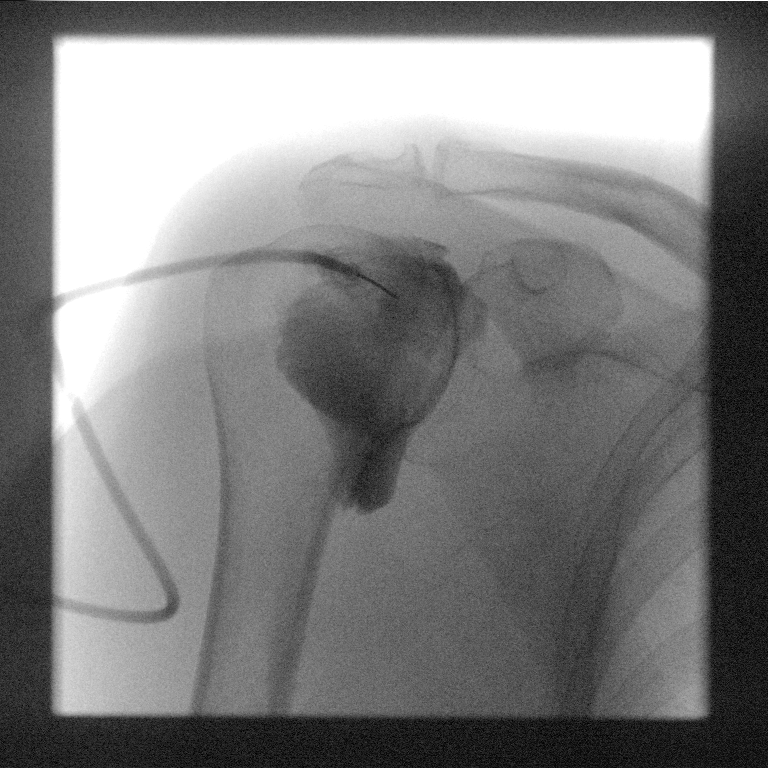
[frame 2/4]
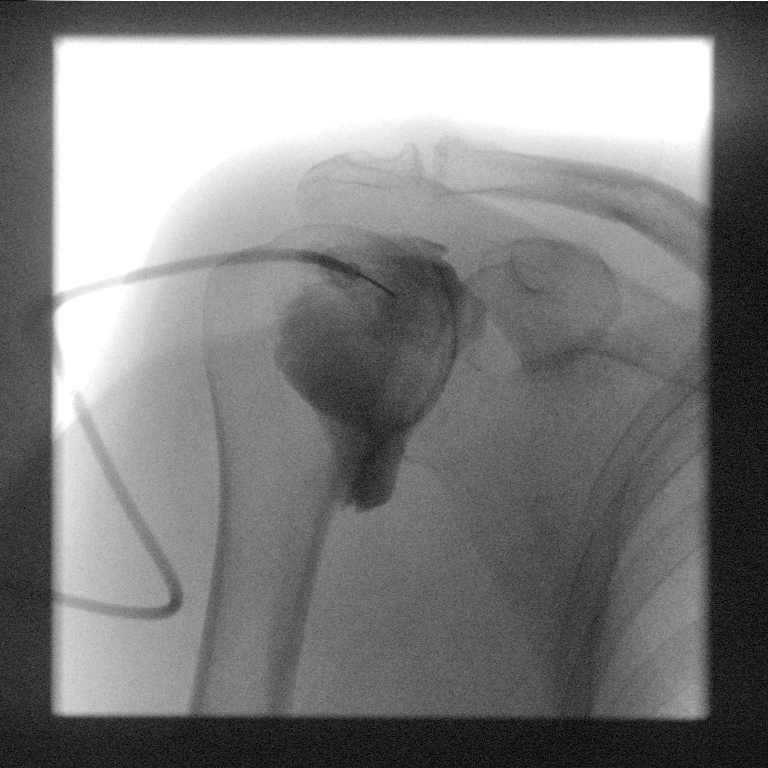
[frame 3/4]
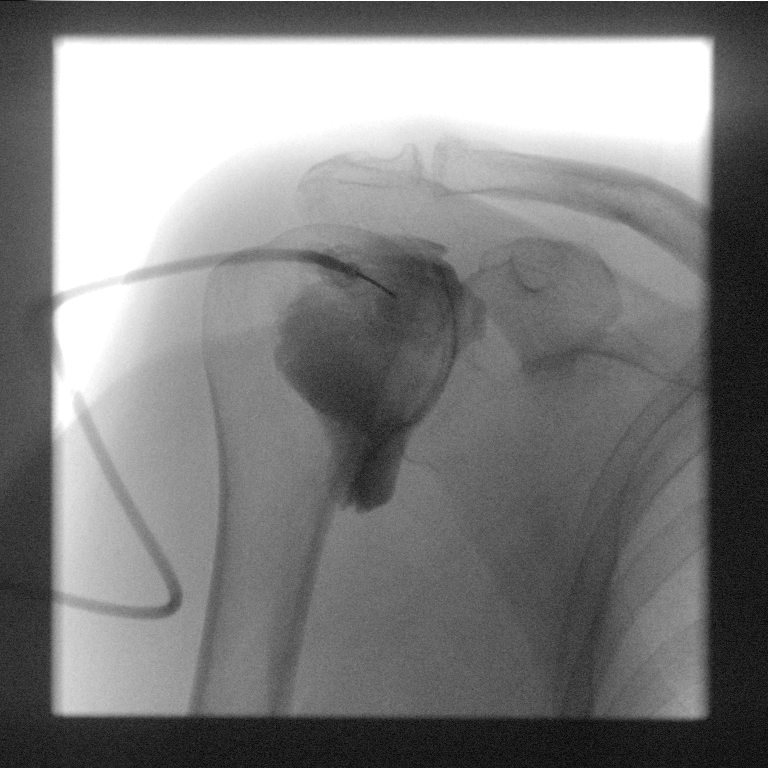
[frame 4/4]
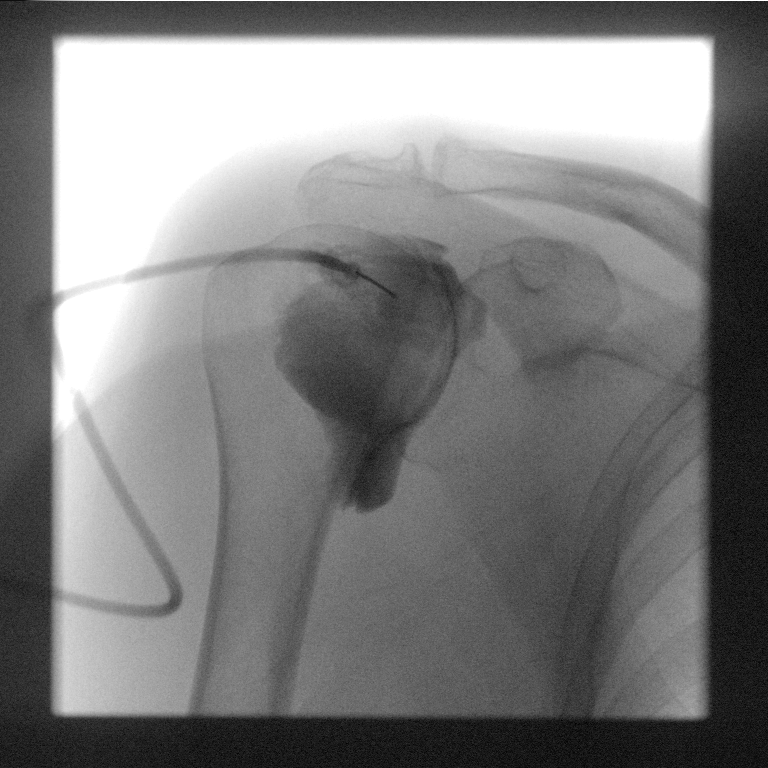

[7 of 7 positions shown; findings below may reference images not displayed]

An appropriate skin entry site was determined under fluoroscopy.
Skin site was marked, prepped with chlorhexidine, and draped in
usual sterile fashion, and infiltrated locally with 1% lidocaine.

22-gauge needle advanced to the superior medial margin of the
humeral head. Intra-articular location was confirmed with loss of
resistance technique with 1 mL of lidocaine 1% injected easily.
Twelveml of a mixture of 5ml saline, and 15ml Omnipaque 180 contrast
was injected into the shoulder joint. Intraarticular flow was
confirmed on fluoroscopy. Patient transferred to CT.

COMPLICATIONS:
COMPLICATIONS
None
IMPRESSION: Technically successful right shoulder injection for CT.

## 2022-12-05 IMAGING — CT CT SHOULDER*R* W/CM
1 series · 12 of 14 positions shown, 15 images · non-contrast
Comparison: None.

CLINICAL DATA: Right shoulder pain

EXAM:
CT ARTHROGRAPHY OF THE right shoulder
TECHNIQUE: Multidetector CT imaging was performed following the standard
protocol after injection of dilute contrast into the joint.

[Series 6: ax st · axial · 0.42mm/px · z∈[-823,-672]mm · 12 of 96 slices shown, 15 images]
[im 8/96  soft-tissue]
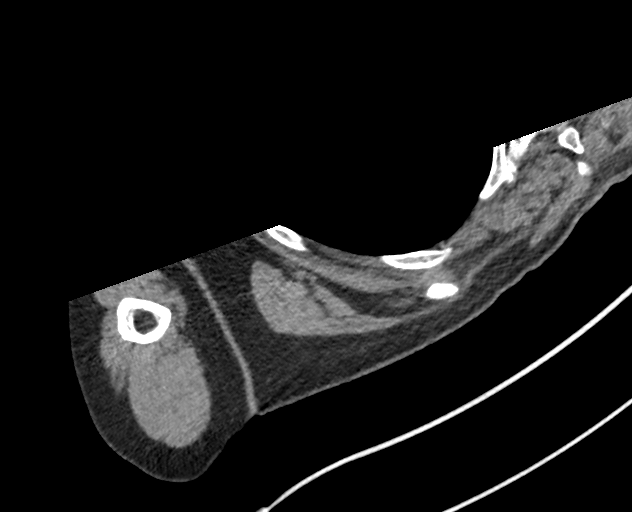
[im 8/96  bone]
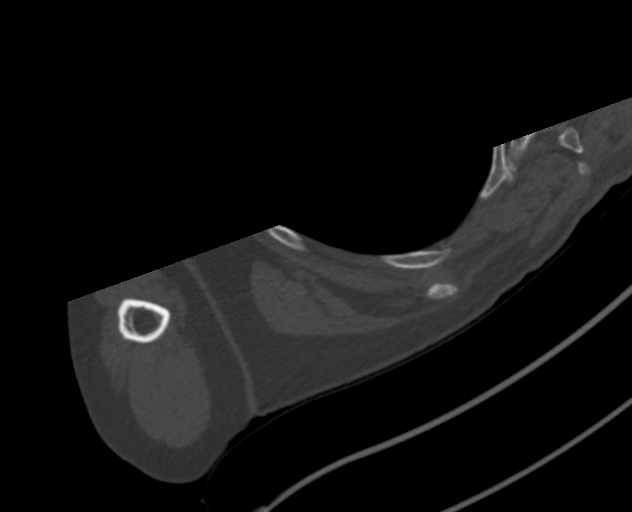
[im 15/96  bone]
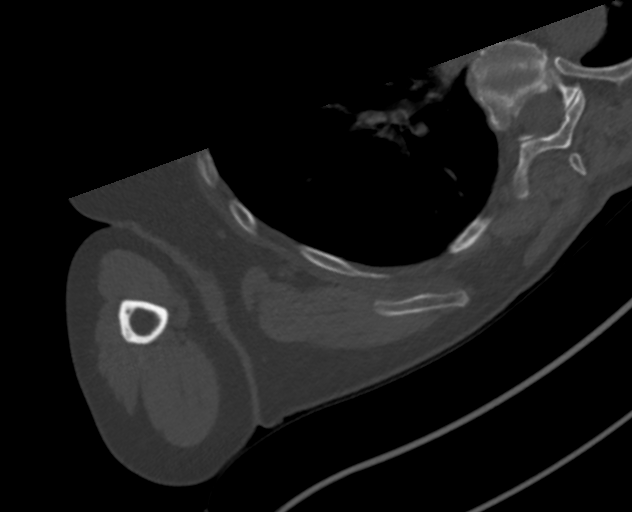
[im 22/96  bone]
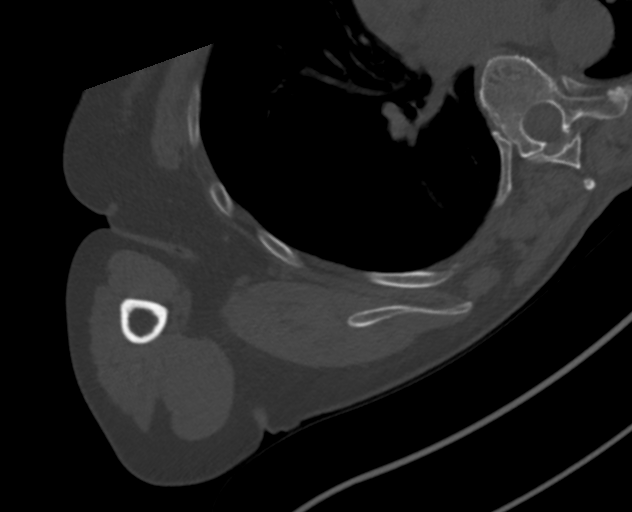
[im 30/96  bone]
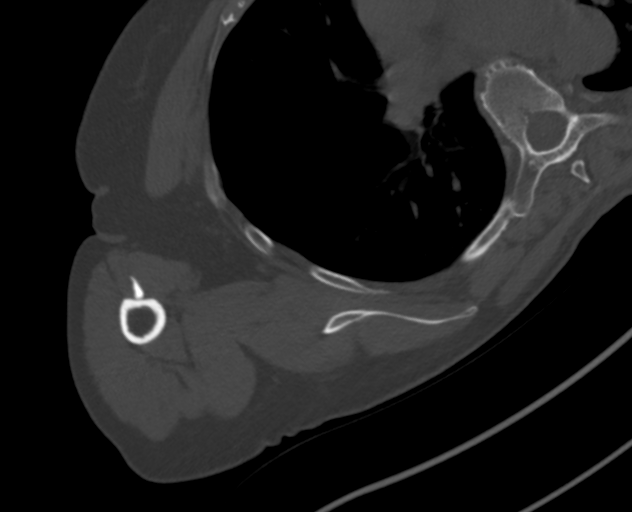
[im 37/96  soft-tissue]
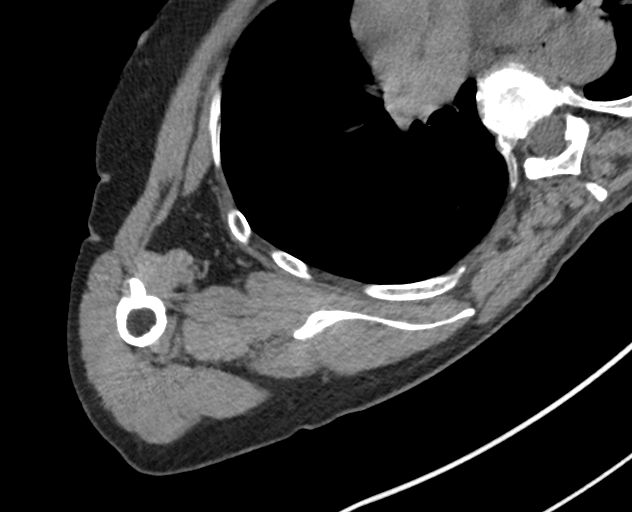
[im 37/96  bone]
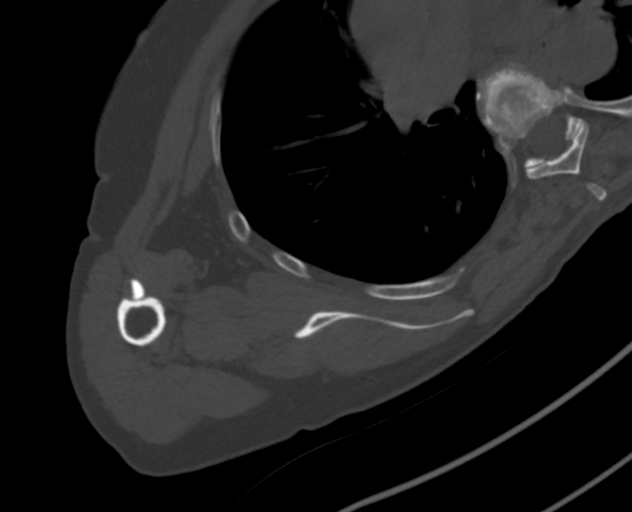
[im 44/96  bone]
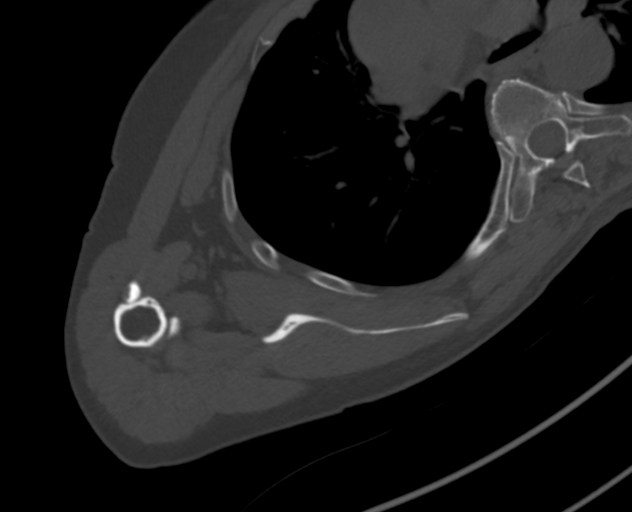
[im 52/96  bone]
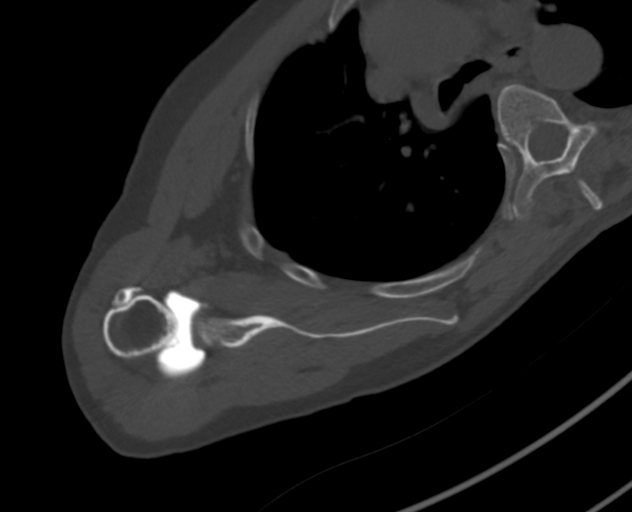
[im 59/96  bone]
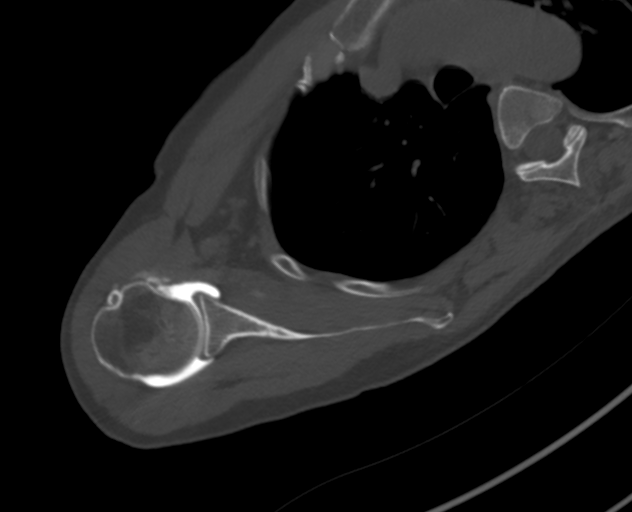
[im 66/96  soft-tissue]
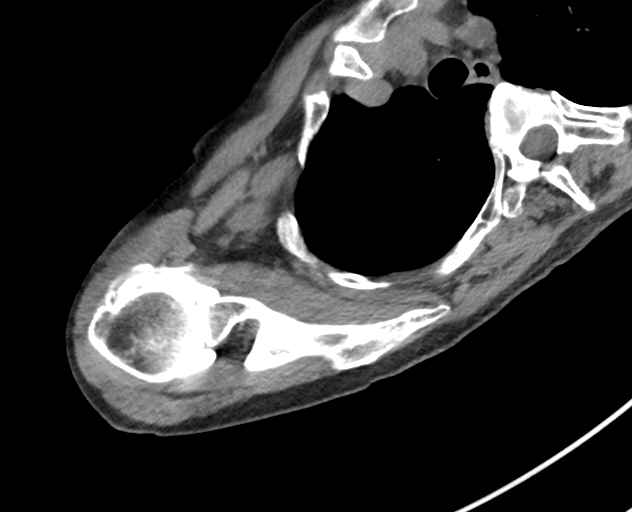
[im 66/96  bone]
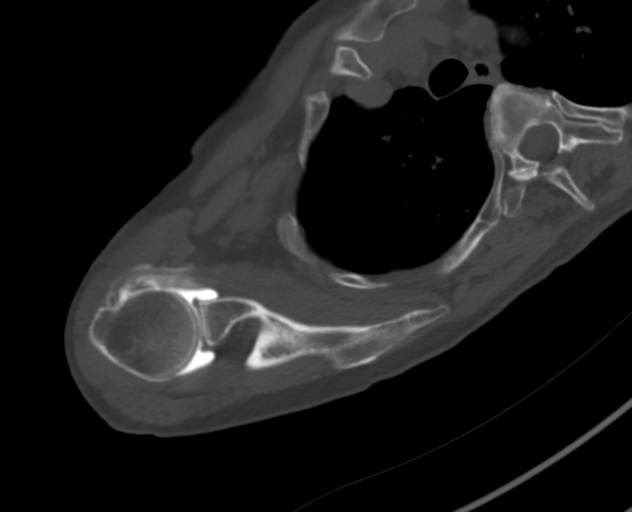
[im 74/96  bone]
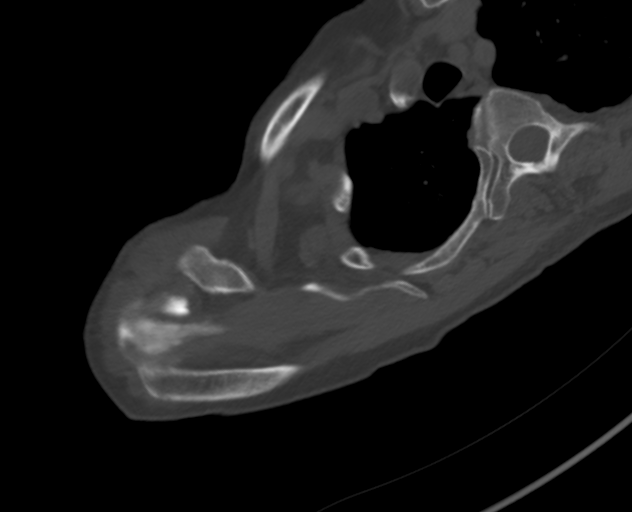
[im 81/96  bone]
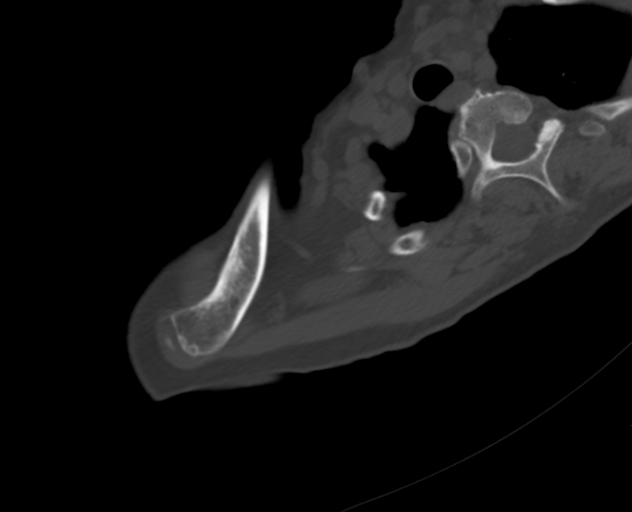
[im 88/96  bone]
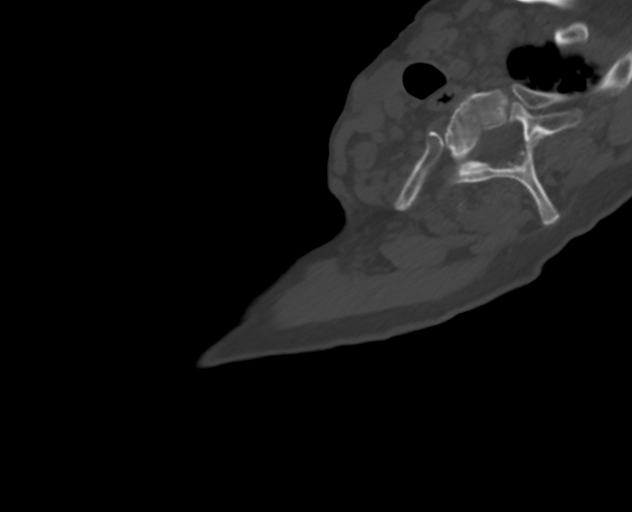

[12 of 14 positions shown; findings below may reference images not displayed]

FINDINGS: Rotator cuff: There is a full-thickness, full width tear of the
supraspinatus tendon at the footprint, with approximately 1.8 cm
retraction. The infraspinatus is intact. Teres minor is intact.
There is contrast within the distal subscapularis tendon, at least
in part related to injection technique. There is a suspected focal
intermediate grade articular sided tear near the footprint (series
4, image 32).

Muscles: No significant muscle atrophy.

Biceps Long Head: Intraarticular and extraarticular portions of the
biceps tendon are intact.

Acromioclavicular Joint: Mild arthropathy of the acromioclavicular
joint. Small subacromial/subdeltoid bursal fluid related to the
full-thickness cuff tear and/or injection.

Glenohumeral Joint: Adequate joint distension by arthrogram
technique. No chondral defect.

Labrum: No evidence of labral tear.

Bones: No fracture or dislocation. No aggressive osseous lesion.

Other: No fluid collection or hematoma.
IMPRESSION: Full-thickness, full width tear of the supraspinatus tendon at the
footprint, with approximately 1.8 cm retraction. Suspected focal
intermediate grade articular sided tear of the subscapularis tendon
at the footprint. No significant muscle atrophy.

No evidence of labral tear.

Mild AC joint arthropathy.

## 2022-12-17 DIAGNOSIS — C4491 Basal cell carcinoma of skin, unspecified: Secondary | ICD-10-CM | POA: Diagnosis not present

## 2022-12-31 DIAGNOSIS — C44519 Basal cell carcinoma of skin of other part of trunk: Secondary | ICD-10-CM | POA: Diagnosis not present

## 2022-12-31 DIAGNOSIS — C4491 Basal cell carcinoma of skin, unspecified: Secondary | ICD-10-CM | POA: Diagnosis not present

## 2022-12-31 DIAGNOSIS — D225 Melanocytic nevi of trunk: Secondary | ICD-10-CM | POA: Diagnosis not present

## 2023-02-03 DIAGNOSIS — L570 Actinic keratosis: Secondary | ICD-10-CM | POA: Diagnosis not present

## 2023-02-20 ENCOUNTER — Ambulatory Visit
Admission: EM | Admit: 2023-02-20 | Discharge: 2023-02-20 | Disposition: A | Discharge status: Home / Self Care | Payer: Federal, State, Local not specified - PPO | Attending: Emergency Medicine | Admitting: Emergency Medicine

## 2023-02-20 DIAGNOSIS — H8110 Benign paroxysmal vertigo, unspecified ear: Secondary | ICD-10-CM | POA: Diagnosis not present

## 2023-02-20 DIAGNOSIS — H6501 Acute serous otitis media, right ear: Secondary | ICD-10-CM | POA: Diagnosis not present

## 2023-02-20 DIAGNOSIS — J069 Acute upper respiratory infection, unspecified: Secondary | ICD-10-CM | POA: Diagnosis not present

## 2023-02-20 MED ORDER — MECLIZINE HCL 12.5 MG PO TABS: 12.5000 mg | ORAL_TABLET | Freq: Three times a day (TID) | ORAL | 0 refills | Status: DC | PRN

## 2023-02-20 MED ORDER — IPRATROPIUM BROMIDE 0.06 % NA SOLN: 2.0000 | Freq: Four times a day (QID) | NASAL | 12 refills | Status: AC

## 2023-02-20 NOTE — ED Triage Notes (Addendum)
Pt c/o vertigo x 3 wks. Was treated for BPPV, states sx's persists with bilateral ear fullness,HA & nausea. Has tried mucinex,zyrtec & diclofenac w/o relief.

## 2023-02-20 NOTE — Discharge Instructions (Signed)
Use the Antivert every 8 hours as needed for dizziness and nausea. You can go up to 2 tablets every 8 hours if needed./  Use the Atrovent nasal spray every 6 hours as needed for nasal congestion. Two squirts in each nostril as needed.  Continue to try and equalize your ears to help clear the fluid that is causing your dizziness.  A heating pad on your ear or face may also provide comfort and help with resolution of your symptoms.

## 2023-02-20 NOTE — ED Provider Notes (Signed)
[facility]    CSN: [id number] Arrival date & time: 02/20/23  0834      History   Chief Complaint Chief Complaint  Patient presents with   Dizziness   Headache   Nausea    HPI Kristin Watkins is a 67 y.o. female.   HPI  67 year old female with a past medical history significant for BPPV, chronic intermittent posttraumatic headache, mixed hyperlipidemia, prediabetes, asthma, and GERD presents for evaluation of 3 weeks worth of vertigo symptoms.  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  In the last week she has developed bilateral ear fullness, headache, and nausea.  She is also complaining of frontal sinus headache and pain in her left cheek.  She has not had a fever or cough.  She does endorse postnasal drip but no significant nasal discharge.  Past Medical History:  Diagnosis Date   Allergy    Asthma    Blood transfusion without reported diagnosis    Cancer (HCC)    skin ca   Chronic bronchitis (HCC)    Complication of anesthesia    Dyspnea    GERD (gastroesophageal reflux disease)    Migraines    PONV (postoperative nausea and vomiting)     Patient Active Problem List   Diagnosis Date Noted   Sinusitis 10/23/2022   Osteopenia of femoral neck 06/27/2022   History of fracture of foot 05/15/2022   Asthma exacerbation, mild 12/23/2021   Menopause syndrome 11/12/2021   S/P arthroscopy of right shoulder 10/18/2021   Chronic intermittent post-traumatic headache 10/18/2021   Elevated blood pressure reading 10/18/2021   Hypercalcemia 10/18/2021   Dysthymia 10/18/2021   Insomnia due to psychological stress 10/18/2021   Polyarthritis 04/17/2021   Cervical radiculopathy 03/22/2020   Pre-diabetes 06/15/2017   Mixed hyperlipidemia 06/15/2017   Allergic rhinitis 10/15/2016   Degeneration of intervertebral disc of lumbar region  07/21/2015   Dysphagia 07/21/2015   Asthma, mild intermittent 07/21/2015   Acid reflux 07/21/2015   Personal history of other malignant neoplasm of skin 11/22/2012    Past Surgical History:  Procedure Laterality Date   BACK SURGERY  2010   lumbar surgery 1989 and Spinal stimulator in 2010   CERVICAL FUSION     C5-T1   ELBOW SURGERY Right 11/2020   ulnar nerve transposition   ESOPHAGOGASTRODUODENOSCOPY  2015   normal   EXCISION/RELEASE BURSA HIP Right 10/2020   Left shoulder surgery      SHOULDER ARTHROSCOPY WITH ROTATOR CUFF REPAIR AND SUBACROMIAL DECOMPRESSION Right 08/26/2021   Procedure: SHOULDER ARTHROSCOPY WITH ROTATOR CUFF REPAIR AND SUBACROMIAL DECOMPRESSION;  Surgeon: Juanell Fairly, MD;  Location: [facility] ORS;  Service: Orthopedics;  Laterality: Right;    OB History   No obstetric history on file.      Home Medications    Prior to Admission medications   Medication Sig Start Date End Date Taking? Authorizing Provider  acetaminophen (TYLENOL) 500 MG tablet Take 500 mg by mouth every 6 (six) hours as needed.   Yes [provider]  albuterol (ACCUNEB) 0.63 MG/3ML nebulizer solution Take 3 mLs (0.63 mg total) by nebulization every 6 (six) hours as needed for wheezing. 08/18/21  Yes Sherlene Shams, MD  budesonide (PULMICORT) 180 MCG/ACT inhaler Inhale 2 puffs into the lungs 2 (two) times daily. 10/23/22  Yes Sherlene Shams, MD  budesonide-formoterol [facility]) 160-4.5 MCG/ACT inhaler  Inhale 2 puffs into the lungs 2 (two) times daily. 02/26/21  Yes Reubin Milan, MD  cyclobenzaprine (FLEXERIL) 10 MG tablet TAKE 1 TABLET BY MOUTH THREE TIMES A DAY AS NEEDED FOR MUSCLE SPASMS 10/13/22  Yes Sherlene Shams, MD  Diclofenac Potassium,Migraine, 50 MG PACK TAKE 50 MG BY MOUTH DAILY. 04/30/22  Yes Worthy Rancher B, FNP  diclofenac Sodium (VOLTAREN) 1 % GEL Apply 4 g topically 4 (four) times daily. 01/10/20  Yes Reubin Milan, MD  gabapentin (NEURONTIN) 100 MG capsule Take  1 capsule (100 mg total) by mouth 3 (three) times daily. 08/07/22  Yes Sherlene Shams, MD  ipratropium (ATROVENT) 0.06 % nasal spray Place 2 sprays into both nostrils 4 (four) times daily. 02/20/23  Yes Becky Augusta, NP  meclizine (ANTIVERT) 12.5 MG tablet Take 1 tablet (12.5 mg total) by mouth 3 (three) times daily as needed for dizziness. 02/20/23  Yes Becky Augusta, NP  Misc Natural Products (IMMUNE TRIO PO) Take by mouth.   Yes [provider]  traZODone (DESYREL) 50 MG tablet TAKE 1/2 TO 1 TABLET BY MOUTH AT BEDTIME AS NEEDED FOR SLEEP 09/22/22  Yes Sherlene Shams, MD  triamcinolone cream (KENALOG) 0.1 % Apply 1 application topically 2 (two) times daily. 04/21/19  Yes Reubin Milan, MD    Family History Family History  Problem Relation Age of Onset   Diabetes Father    Renal cancer Father    Breast cancer Neg Hx     Social History Social History   Tobacco Use   Smoking status: Never   Smokeless tobacco: Never  Vaping Use   Vaping Use: Never used  Substance Use Topics   Alcohol use: Yes    Alcohol/week: 1.0 - 2.0 standard drink of alcohol    Types: 1 - 2 Glasses of wine per week    Comment: occasionally   Drug use: No     Allergies   Tape   Review of Systems Review of Systems  Constitutional:  Negative for fever.  HENT:  Positive for congestion, ear pain and postnasal drip. Negative for rhinorrhea.   Respiratory:  Negative for cough.   Gastrointestinal:  Positive for nausea.  Neurological:  Positive for dizziness and headaches.     Physical Exam Triage Vital Signs ED Triage Vitals [02/20/23 0907]  Enc Vitals Group     BP      Pulse      Resp 16     Temp      Temp Source Oral     SpO2      Weight      Height      Head Circumference      Peak Flow      Pain Score      Pain Loc      Pain Edu?      Excl. in GC?    No data found.  Updated Vital Signs BP (!) 163/84 (BP Location: Left Arm)   Pulse 86   Temp 97.7 F (36.5 C) (Oral)   Resp 16    Ht 5\' 4"  (1.626 m)   Wt 133 lb (60.3 kg)   SpO2 98%   BMI 22.83 kg/m   Visual Acuity Right Eye Distance:   Left Eye Distance:   Bilateral Distance:    Right Eye Near:   Left Eye Near:    Bilateral Near:     Physical Exam Vitals and nursing note reviewed.  Constitutional:  Appearance: Normal appearance. She is not ill-appearing.  HENT:     Head: Normocephalic and atraumatic.     Right Ear: Ear canal and external ear normal. There is no impacted cerumen.     Left Ear: Tympanic membrane, ear canal and external ear normal. There is no impacted cerumen.     Ears:     Comments: Right TM has a serous effusion present.  Left TM has mild erythema around the rim but is otherwise pearly gray in appearance with normal light reflex.    Nose: Congestion and rhinorrhea present.     Comments: Nasal mucosa is edematous but pink.  Scant clear discharge present on exam.    Mouth/Throat:     Mouth: Mucous membranes are moist.     Pharynx: Oropharynx is clear. Posterior oropharyngeal erythema present. No oropharyngeal exudate.     Comments: Mild erythema to the posterior oropharynx with clear postnasal drip. Eyes:     General: No scleral icterus.    Extraocular Movements: Extraocular movements intact.     Pupils: Pupils are equal, round, and reactive to light.  Skin:    General: Skin is warm and dry.     Capillary Refill: Capillary refill takes less than 2 seconds.  Neurological:     General: No focal deficit present.     Mental Status: She is alert and oriented to person, place, and time.      UC Treatments / Results  Labs (all labs ordered are listed, but only abnormal results are displayed) Labs Reviewed - No data to display  EKG   Radiology No results found.  Procedures Procedures (including critical care time)  Medications Ordered in UC Medications - No data to display  Initial Impression / Assessment and Plan / UC Course  I have reviewed the triage vital signs  and the nursing notes.  Pertinent labs & imaging results that were available during my care of the patient were reviewed by me and considered in my medical decision making (see chart for details).   Patient is a pleasant, nontoxic-appearing 67 year old female presenting for evaluation of 3 weeks worth of vertigo type symptoms which have not resolved with either home Epley maneuver or with Epley maneuver performed by physical therapy in the department where she works.  She states that typically that does help but has not resolved that she this time.  She does report that she has a frequent history of sinus infections but that this does not feel like a typical sinus infection.  On exam she does not have any nystagmus and her EOM is intact.  Pupils are both equal round reactive.  She does have serous fluid behind her right tympanic membrane as well as some mild erythema around the rim of the left tympanic membrane and inflamed nasal mucosa.  I do believe that she has a viral URI that the fluid in her middle ear is what is affecting her vertigo.  She has not used meclizine in the past so we will give a trial of 12.5 mg 3 times a day.  I did advise the patient she can go up to 25 mg 3 times a day as needed.  I will also provide Atrovent nasal spray to help with the congestion and deceiving get her middle ear fluid to drain.  She should also continue to try and equalize her ears to see if she can clear the fluid.  Return precautions reviewed.   Final Clinical Impressions(s) / UC Diagnoses  Final diagnoses:  Acute URI  Non-recurrent acute serous otitis media of right ear  Benign paroxysmal positional vertigo, unspecified laterality     Discharge Instructions      Use the Antivert every 8 hours as needed for dizziness and nausea. You can go up to 2 tablets every 8 hours if needed./  Use the Atrovent nasal spray every 6 hours as needed for nasal congestion. Two squirts in each nostril as  needed.  Continue to try and equalize your ears to help clear the fluid that is causing your dizziness.  A heating pad on your ear or face may also provide comfort and help with resolution of your symptoms.      ED Prescriptions     Medication Sig Dispense Auth. Provider   meclizine (ANTIVERT) 12.5 MG tablet Take 1 tablet (12.5 mg total) by mouth 3 (three) times daily as needed for dizziness. 30 tablet Becky Augusta, NP   ipratropium (ATROVENT) 0.06 % nasal spray Place 2 sprays into both nostrils 4 (four) times daily. 15 mL Becky Augusta, NP      PDMP not reviewed this encounter.   Becky Augusta, NP 02/20/23 [phone]

## 2023-02-22 ENCOUNTER — Ambulatory Visit: Payer: Federal, State, Local not specified - PPO | Admitting: Internal Medicine

## 2023-02-22 ENCOUNTER — Encounter: Payer: Self-pay | Admitting: Internal Medicine

## 2023-02-22 VITALS — BP 150/80 | HR 112 | Temp 97.9°F | Ht 64.0 in | Wt 143.4 lb

## 2023-02-22 DIAGNOSIS — J0100 Acute maxillary sinusitis, unspecified: Secondary | ICD-10-CM | POA: Diagnosis not present

## 2023-02-22 MED ORDER — DIAZEPAM 5 MG PO TABS: 5.0000 mg | ORAL_TABLET | Freq: Three times a day (TID) | ORAL | 0 refills | Status: AC | PRN

## 2023-02-22 MED ORDER — PREDNISONE 10 MG PO TABS: ORAL_TABLET | ORAL | 0 refills | Status: DC

## 2023-02-22 MED ORDER — AMOXICILLIN-POT CLAVULANATE 875-125 MG PO TABS: 1.0000 | ORAL_TABLET | Freq: Two times a day (BID) | ORAL | 0 refills | Status: DC

## 2023-02-22 NOTE — Progress Notes (Unsigned)
Subjective:  Patient ID: Kristin Watkins, female    DOB: Nov 30, 1955  Age: 67 y.o. MRN: [id number]  CC: There were no encounter diagnoses.   HPI Avina Coldren presents for  Chief Complaint  Patient presents with   Sinusitis    Vertigo, vestibular issues, headache, clogged ears, nausea, PND x 3 weeks    Sinusitis for 3 weeks accompanied by vertigo, headaches,  nausea . Marland Kitchen  Was treated with Epley maneuver for BPPV  ,  didn't work.   2nd attempt not helpful.   One week aog constnat dizziness maxillaru sinus pain  bilateral frontal pain .  No fever or cough   Outpatient Medications Prior to Visit  Medication Sig Dispense Refill   acetaminophen (TYLENOL) 500 MG tablet Take 500 mg by mouth every 6 (six) hours as needed.     albuterol (ACCUNEB) 0.63 MG/3ML nebulizer solution Take 3 mLs (0.63 mg total) by nebulization every 6 (six) hours as needed for wheezing. 75 mL 1   budesonide (PULMICORT) 180 MCG/ACT inhaler Inhale 2 puffs into the lungs 2 (two) times daily. 1 each 11   budesonide-formoterol (SYMBICORT) 160-4.5 MCG/ACT inhaler Inhale 2 puffs into the lungs 2 (two) times daily. 1 each 5   cyclobenzaprine (FLEXERIL) 10 MG tablet TAKE 1 TABLET BY MOUTH THREE TIMES A DAY AS NEEDED FOR MUSCLE SPASMS 90 tablet 3   Diclofenac Potassium,Migraine, 50 MG PACK TAKE 50 MG BY MOUTH DAILY. 27 each 2   diclofenac Sodium (VOLTAREN) 1 % GEL Apply 4 g topically 4 (four) times daily. 150 g 2   gabapentin (NEURONTIN) 100 MG capsule Take 1 capsule (100 mg total) by mouth 3 (three) times daily. 90 capsule 5   ipratropium (ATROVENT) 0.06 % nasal spray Place 2 sprays into both nostrils 4 (four) times daily. 15 mL 12   meclizine (ANTIVERT) 12.5 MG tablet Take 1 tablet (12.5 mg total) by mouth 3 (three) times daily as needed for dizziness. 30 tablet 0   Misc Natural Products (IMMUNE TRIO PO) Take by mouth.     traZODone (DESYREL) 50 MG tablet TAKE 1/2 TO 1 TABLET BY MOUTH AT BEDTIME AS NEEDED FOR SLEEP 90  tablet 2   triamcinolone cream (KENALOG) 0.1 % Apply 1 application topically 2 (two) times daily. 80 g 0   No facility-administered medications prior to visit.    Review of Systems;  Patient denies headache, fevers, malaise, unintentional weight loss, skin rash, eye pain, sinus congestion and sinus pain, sore throat, dysphagia,  hemoptysis , cough, dyspnea, wheezing, chest pain, palpitations, orthopnea, edema, abdominal pain, nausea, melena, diarrhea, constipation, flank pain, dysuria, hematuria, urinary  Frequency, nocturia, numbness, tingling, seizures,  Focal weakness, Loss of consciousness,  Tremor, insomnia, depression, anxiety, and suicidal ideation.      Objective:  BP (!) 150/80   Pulse (!) 112   Temp 97.9 F (36.6 C) (Oral)   Ht 5\' 4"  (1.626 m)   Wt 143 lb 6.4 oz (65 kg)   SpO2 95%   BMI 24.61 kg/m   BP Readings from Last 3 Encounters:  02/22/23 (!) 150/80  02/20/23 (!) 163/84  10/23/22 132/88    Wt Readings from Last 3 Encounters:  02/22/23 143 lb 6.4 oz (65 kg)  02/20/23 133 lb (60.3 kg)  10/23/22 140 lb 6.4 oz (63.7 kg)    Physical Exam  Lab Results  Component Value Date   HGBA1C 6.2 04/16/2021   HGBA1C 6.0 (H) 01/10/2020   HGBA1C 5.8 (H) 06/15/2017  Lab Results  Component Value Date   CREATININE 0.87 05/15/2022   CREATININE 0.73 10/17/2021   CREATININE 0.87 04/16/2021    Lab Results  Component Value Date   WBC 5.3 05/15/2022   HGB 13.0 05/15/2022   HCT 38.9 05/15/2022   PLT 228.0 05/15/2022   GLUCOSE 75 05/15/2022   CHOL 245 (H) 10/17/2021   TRIG 85.0 10/17/2021   HDL 89.80 10/17/2021   LDLCALC 138 (H) 10/17/2021   ALT 9 05/15/2022   AST 13 05/15/2022   NA 139 05/15/2022   K 4.1 05/15/2022   CL 100 05/15/2022   CREATININE 0.87 05/15/2022   BUN 18 05/15/2022   CO2 30 05/15/2022   TSH 1.29 05/15/2022   INR 0.8 10/09/2011   HGBA1C 6.2 04/16/2021   MICROALBUR <0.7 10/17/2021    No results found.  Assessment & Plan:  .There are  no diagnoses linked to this encounter.   I provided 30 minutes of face-to-face time during this encounter reviewing patient's last visit with me, patient's  most recent visit with cardiology,  nephrology,  and neurology,  recent surgical and non surgical procedures, previous  labs and imaging studies, counseling on currently addressed issues,  and post visit ordering to diagnostics and therapeutics .   Follow-up: No follow-ups on file.   Sherlene Shams, MD

## 2023-02-22 NOTE — Patient Instructions (Addendum)
Resume the prednisone taper  Start augmentin twice daily  Continue probiotic   Afrin twice daily for 5 days  Valium 5 to 10 mg every 8 hours as needed for vertigo    Take a PPI (omeprazole )  while you are combining prednisone with diclofenac

## 2023-02-23 NOTE — Assessment & Plan Note (Signed)
Given chronicity of symptoms, development of  vertigo, facial pain and exam consistent with bacterial URI,  Will treat with empiric antibiotics, decongestants, and saline lavage.  Adding steroid nasal spray if not already taking.

## 2023-02-25 DIAGNOSIS — H6983 Other specified disorders of Eustachian tube, bilateral: Secondary | ICD-10-CM | POA: Diagnosis not present

## 2023-02-25 DIAGNOSIS — R42 Dizziness and giddiness: Secondary | ICD-10-CM | POA: Diagnosis not present

## 2023-02-25 DIAGNOSIS — J342 Deviated nasal septum: Secondary | ICD-10-CM | POA: Diagnosis not present

## 2023-02-25 DIAGNOSIS — J301 Allergic rhinitis due to pollen: Secondary | ICD-10-CM | POA: Diagnosis not present

## 2023-02-28 ENCOUNTER — Encounter: Payer: Self-pay | Admitting: Internal Medicine

## 2023-03-02 MED ORDER — RIZATRIPTAN BENZOATE 10 MG PO TBDP: 10.0000 mg | ORAL_TABLET | ORAL | 0 refills | Status: DC | PRN

## 2023-03-30 ENCOUNTER — Other Ambulatory Visit: Payer: Self-pay | Admitting: Internal Medicine

## 2023-04-07 ENCOUNTER — Ambulatory Visit: Payer: Federal, State, Local not specified - PPO | Admitting: Internal Medicine

## 2023-04-26 ENCOUNTER — Other Ambulatory Visit: Payer: Self-pay | Admitting: Internal Medicine

## 2023-04-27 NOTE — Telephone Encounter (Signed)
Refilled: 08/07/2022 Last OV: 05/15/2022 Next OV: not scheduled

## 2023-05-05 ENCOUNTER — Other Ambulatory Visit: Payer: Self-pay | Admitting: Internal Medicine

## 2023-05-17 ENCOUNTER — Encounter: Payer: Self-pay | Admitting: Internal Medicine

## 2023-05-18 ENCOUNTER — Telehealth: Payer: Self-pay

## 2023-05-18 ENCOUNTER — Ambulatory Visit
Admission: RE | Admit: 2023-05-18 | Discharge: 2023-05-18 | Disposition: A | Discharge status: Home / Self Care | Payer: Federal, State, Local not specified - PPO | Source: Ambulatory Visit | Attending: Physician Assistant | Admitting: Physician Assistant

## 2023-05-18 VITALS — BP 150/91 | HR 99 | Temp 97.9°F | Resp 16 | Ht 66.0 in | Wt 135.0 lb

## 2023-05-18 DIAGNOSIS — L03113 Cellulitis of right upper limb: Secondary | ICD-10-CM

## 2023-05-18 DIAGNOSIS — Z23 Encounter for immunization: Secondary | ICD-10-CM | POA: Diagnosis not present

## 2023-05-18 DIAGNOSIS — S61411A Laceration without foreign body of right hand, initial encounter: Secondary | ICD-10-CM | POA: Diagnosis not present

## 2023-05-18 MED ORDER — CEPHALEXIN 500 MG PO CAPS: 500.0000 mg | ORAL_CAPSULE | Freq: Four times a day (QID) | ORAL | 0 refills | Status: AC

## 2023-05-18 MED ORDER — TETANUS-DIPHTH-ACELL PERTUSSIS 5-2.5-18.5 LF-MCG/0.5 IM SUSY
0.5000 mL | PREFILLED_SYRINGE | Freq: Once | INTRAMUSCULAR | Status: AC
  Administered 2023-05-18: 0.5 mL via INTRAMUSCULAR

## 2023-05-18 NOTE — ED Provider Notes (Signed)
[facility]    CSN: [id number] Arrival date & time: 05/18/23  1342      History   Chief Complaint Chief Complaint  Patient presents with   Laceration    HPI Kristin Watkins is a 67 y.o. female presenting for a minor laceration of the right dorsal hand.  Patient states she cut it on a dirty lumbar blade yesterday.  She has cleaned the wound.  She states her last tetanus immunization was 2017.  She denies any significant pain.  She says the area around the wound is erythematous, warm and sore.  She denies any drainage from the wound.  No fevers.  No other complaints.  HPI  Past Medical History:  Diagnosis Date   Allergy    Asthma    Blood transfusion without reported diagnosis    Cancer (HCC)    skin ca   Chronic bronchitis (HCC)    Complication of anesthesia    Dyspnea    GERD (gastroesophageal reflux disease)    Migraines    PONV (postoperative nausea and vomiting)     Patient Active Problem List   Diagnosis Date Noted   Sinusitis 10/23/2022   Osteopenia of femoral neck 06/27/2022   History of fracture of foot 05/15/2022   Menopause syndrome 11/12/2021   S/P arthroscopy of right shoulder 10/18/2021   Chronic intermittent post-traumatic headache 10/18/2021   Hypercalcemia 10/18/2021   Dysthymia 10/18/2021   Insomnia due to psychological stress 10/18/2021   Polyarthritis 04/17/2021   Cervical radiculopathy 03/22/2020   Pre-diabetes 06/15/2017   Mixed hyperlipidemia 06/15/2017   Allergic rhinitis 10/15/2016   Degeneration of intervertebral disc of lumbar region 07/21/2015   Dysphagia 07/21/2015   Asthma, mild intermittent 07/21/2015   Acid reflux 07/21/2015   Personal history of other malignant neoplasm of skin 11/22/2012    Past Surgical History:  Procedure Laterality Date   BACK SURGERY  2010   lumbar surgery 1989 and Spinal stimulator in 2010   CERVICAL FUSION     C5-T1   ELBOW SURGERY Right 11/2020   ulnar nerve transposition    ESOPHAGOGASTRODUODENOSCOPY  2015   normal   EXCISION/RELEASE BURSA HIP Right 10/2020   Left shoulder surgery      SHOULDER ARTHROSCOPY WITH ROTATOR CUFF REPAIR AND SUBACROMIAL DECOMPRESSION Right 08/26/2021   Procedure: SHOULDER ARTHROSCOPY WITH ROTATOR CUFF REPAIR AND SUBACROMIAL DECOMPRESSION;  Surgeon: Juanell Fairly, MD;  Location: [facility];  Service: Orthopedics;  Laterality: Right;    OB History   No obstetric history on file.      Home Medications    Prior to Admission medications   Medication Sig Start Date End Date Taking? Authorizing Provider  acetaminophen (TYLENOL) 500 MG tablet Take 500 mg by mouth every 6 (six) hours as needed.   Yes [provider]  albuterol (ACCUNEB) 0.63 MG/3ML nebulizer solution Take 3 mLs (0.63 mg total) by nebulization every 6 (six) hours as needed for wheezing. 08/18/21  Yes Sherlene Shams, MD  budesonide (PULMICORT) 180 MCG/ACT inhaler Inhale 2 puffs into the lungs 2 (two) times daily. 10/23/22  Yes Sherlene Shams, MD  budesonide-formoterol (SYMBICORT) 160-4.5 MCG/ACT inhaler Inhale 2 puffs into the lungs 2 (two) times daily. 02/26/21  Yes Reubin Milan, MD  cephALEXin (KEFLEX) 500 MG capsule Take 1 capsule (500 mg total) by mouth 4 (four) times daily for 7 days. 05/18/23 05/25/23 Yes Eusebio Friendly B, PA-C  cyclobenzaprine (FLEXERIL) 10 MG tablet TAKE 1 TABLET BY MOUTH THREE TIMES A  DAY AS NEEDED FOR MUSCLE SPASMS 10/13/22  Yes Sherlene Shams, MD  diazepam (VALIUM) 5 MG tablet Take 1 tablet (5 mg total) by mouth every 8 (eight) hours as needed for anxiety. 02/22/23  Yes Sherlene Shams, MD  Diclofenac Potassium,Migraine, 50 MG PACK TAKE 50 MG BY MOUTH DAILY. 04/30/22  Yes Worthy Rancher B, FNP  diclofenac Sodium (VOLTAREN) 1 % GEL Apply 4 g topically 4 (four) times daily. 01/10/20  Yes Reubin Milan, MD  gabapentin (NEURONTIN) 100 MG capsule TAKE 1 CAPSULE (100 MG TOTAL) BY MOUTH THREE TIMES DAILY. 04/27/23  Yes Sherlene Shams, MD   ipratropium (ATROVENT) 0.06 % nasal spray Place 2 sprays into both nostrils 4 (four) times daily. 02/20/23  Yes Becky Augusta, NP  meclizine (ANTIVERT) 12.5 MG tablet Take 1 tablet (12.5 mg total) by mouth 3 (three) times daily as needed for dizziness. 02/20/23  Yes Becky Augusta, NP  Misc Natural Products (IMMUNE TRIO PO) Take by mouth.   Yes [provider]  rizatriptan (MAXALT-MLT) 10 MG disintegrating tablet TAKE 1 TABLET BY MOUTH AS NEEDED FOR MIGRAINE. MAY REPEAT IN 2 HOURS IF NEEDED 05/05/23  Yes Sherlene Shams, MD  traZODone (DESYREL) 50 MG tablet TAKE 1/2 TO 1 TABLET BY MOUTH AT BEDTIME AS NEEDED FOR SLEEP 09/22/22  Yes Sherlene Shams, MD  triamcinolone cream (KENALOG) 0.1 % Apply 1 application topically 2 (two) times daily. 04/21/19  Yes Reubin Milan, MD    Family History Family History  Problem Relation Age of Onset   Diabetes Father    Renal cancer Father    Breast cancer Neg Hx     Social History Social History   Tobacco Use   Smoking status: Never   Smokeless tobacco: Never  Vaping Use   Vaping status: Never Used  Substance Use Topics   Alcohol use: Yes    Alcohol/week: 1.0 - 2.0 standard drink of alcohol    Types: 1 - 2 Glasses of wine per week    Comment: occasionally   Drug use: No     Allergies   Tape   Review of Systems Review of Systems  Constitutional:  Negative for fever.  Musculoskeletal:  Positive for arthralgias and joint swelling.  Skin:  Positive for color change and wound.  Neurological:  Negative for weakness and numbness.     Physical Exam Triage Vital Signs ED Triage Vitals  Encounter Vitals Group     BP      Systolic BP Percentile      Diastolic BP Percentile      Pulse      Resp      Temp      Temp src      SpO2      Weight      Height      Head Circumference      Peak Flow      Pain Score      Pain Loc      Pain Education      Exclude from Growth Chart    No data found.  Updated Vital Signs BP (!) 150/91  (BP Location: Right Arm)   Pulse 99   Temp 97.9 F (36.6 C) (Oral)   Resp 16   Ht 5\' 6"  (1.676 m)   Wt 135 lb (61.2 kg)   SpO2 97%   BMI 21.79 kg/m      Physical Exam Vitals and nursing note reviewed.  Constitutional:  General: She is not in acute distress.    Appearance: Normal appearance. She is not ill-appearing or toxic-appearing.  HENT:     Head: Normocephalic and atraumatic.  Eyes:     General: No scleral icterus.       Right eye: No discharge.        Left eye: No discharge.     Conjunctiva/sclera: Conjunctivae normal.  Cardiovascular:     Rate and Rhythm: Normal rate.     Pulses: Normal pulses.  Pulmonary:     Effort: Pulmonary effort is normal. No respiratory distress.  Musculoskeletal:     Cervical back: Neck supple.  Skin:    General: Skin is dry.     Findings: Erythema present.     Comments: RIGHT HAND: There is a 1 cm superficial laceration of right dorsal hand with surrounding erythema/warmth/TTP.   Neurological:     General: No focal deficit present.     Mental Status: She is alert. Mental status is at baseline.     Motor: No weakness.     Gait: Gait normal.  Psychiatric:        Mood and Affect: Mood normal.        Behavior: Behavior normal.        Thought Content: Thought content normal.      UC Treatments / Results  Labs (all labs ordered are listed, but only abnormal results are displayed) Labs Reviewed - No data to display  EKG   Radiology No results found.  Procedures Procedures (including critical care time)  Medications Ordered in UC Medications  Tdap (BOOSTRIX) injection 0.5 mL (has no administration in time range)    Initial Impression / Assessment and Plan / UC Course  I have reviewed the triage vital signs and the nursing notes.  Pertinent labs & imaging results that were available during my care of the patient were reviewed by me and considered in my medical decision making (see chart for details).   67 year old  female presents for laceration, redness, swelling since yesterday.  Cut her hand on a dirty lawnmower blade.  Tetanus last given in 2017.  Request tetanus immunization today.  On exam she does have a superficial wound with evidence of early cellulitis.  Will update tetanus.  Starting patient on Keflex.  Discussed wound care guidelines.  Discussed use of OTC meds, cryotherapy.  Reviewed return and ER precautions.   Final Clinical Impressions(s) / UC Diagnoses   Final diagnoses:  Laceration of right hand without foreign body, initial encounter  Cellulitis of right hand     Discharge Instructions      -Clean with soap and water every day. - I sent antibiotics because it looks like you are developing an infection. - Your tetanus was updated. - Ice, Tylenol, Motrin as needed. - If you have any worsening of the redness, pain or run a fever please return or go to ER.     ED Prescriptions     Medication Sig Dispense Auth. Provider   cephALEXin (KEFLEX) 500 MG capsule Take 1 capsule (500 mg total) by mouth 4 (four) times daily for 7 days. 28 capsule Shirlee Latch, PA-C      PDMP not reviewed this encounter.   Shirlee Latch, PA-C 05/18/23 1359

## 2023-05-18 NOTE — Discharge Instructions (Addendum)
-  Clean with soap and water every day. - I sent antibiotics because it looks like you are developing an infection. - Your tetanus was updated. - Ice, Tylenol, Motrin as needed. - If you have any worsening of the redness, pain or run a fever please return or go to ER.

## 2023-05-18 NOTE — ED Triage Notes (Signed)
Pt c/o laceration in R hand x1 day. 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 Last tetanus 01/06/16

## 2023-05-19 NOTE — Telephone Encounter (Signed)
Error

## 2023-06-17 ENCOUNTER — Other Ambulatory Visit: Payer: Self-pay | Admitting: Internal Medicine

## 2023-10-15 ENCOUNTER — Other Ambulatory Visit: Payer: Self-pay | Admitting: Family

## 2023-10-15 DIAGNOSIS — G4484 Primary exertional headache: Secondary | ICD-10-CM

## 2023-10-23 ENCOUNTER — Ambulatory Visit
Admission: RE | Admit: 2023-10-23 | Discharge: 2023-10-23 | Disposition: A | Discharge status: Home / Self Care | Payer: Self-pay | Source: Ambulatory Visit | Attending: Emergency Medicine | Admitting: Emergency Medicine

## 2023-10-23 VITALS — BP 163/89 | HR 73 | Temp 98.7°F | Resp 18

## 2023-10-23 DIAGNOSIS — J069 Acute upper respiratory infection, unspecified: Secondary | ICD-10-CM

## 2023-10-23 DIAGNOSIS — Z20828 Contact with and (suspected) exposure to other viral communicable diseases: Secondary | ICD-10-CM | POA: Diagnosis not present

## 2023-10-23 LAB — POC RSV: RSV Antigen, POC: NEGATIVE

## 2023-10-23 MED ORDER — PROMETHAZINE-DM 6.25-15 MG/5ML PO SYRP: 5.0000 mL | ORAL_SOLUTION | Freq: Every evening | ORAL | 0 refills | Status: AC | PRN

## 2023-10-23 MED ORDER — BENZONATATE 100 MG PO CAPS: 100.0000 mg | ORAL_CAPSULE | Freq: Three times a day (TID) | ORAL | 0 refills | Status: DC

## 2023-10-23 MED ORDER — PREDNISONE 10 MG (21) PO TBPK: ORAL_TABLET | Freq: Every day | ORAL | 0 refills | Status: AC

## 2023-10-23 MED ORDER — AZITHROMYCIN 250 MG PO TABS: 250.0000 mg | ORAL_TABLET | Freq: Every day | ORAL | 0 refills | Status: DC

## 2023-10-23 NOTE — ED Provider Notes (Signed)
Kristin Watkins    CSN: [id number] Arrival date & time: 10/23/23  0849      History   Chief Complaint Chief Complaint  Patient presents with   Cough    Headache fatigue temperature - Entered by patient    HPI Kristin Watkins is a 68 y.o. female.   Patient presents for evaluation of increased fatigue and malaise, subjective fever, nasal congestion, primarily nonproductive cough, wheezing triggered by coughing, sore throat, diarrhea and intermittent headaches present for 4 days.  Sore throat has resolved.  Tolerating food and liquids.  Has attempted use of Mucinex, diclofenac powder, lemon ginger tea and nebulizer inhaler.  History of asthma, denies shortness of breath.  exposure to RSV.   Past Medical History:  Diagnosis Date   Allergy    Asthma    Blood transfusion without reported diagnosis    Cancer (HCC)    skin ca   Chronic bronchitis (HCC)    Complication of anesthesia    Dyspnea    GERD (gastroesophageal reflux disease)    Migraines    PONV (postoperative nausea and vomiting)     Patient Active Problem List   Diagnosis Date Noted   Sinusitis 10/23/2022   Osteopenia of femoral neck 06/27/2022   History of fracture of foot 05/15/2022   Menopause syndrome 11/12/2021   S/P arthroscopy of right shoulder 10/18/2021   Chronic intermittent post-traumatic headache 10/18/2021   Hypercalcemia 10/18/2021   Dysthymia 10/18/2021   Insomnia due to psychological stress 10/18/2021   Polyarthritis 04/17/2021   Cervical radiculopathy 03/22/2020   Pre-diabetes 06/15/2017   Mixed hyperlipidemia 06/15/2017   Allergic rhinitis 10/15/2016   Degeneration of intervertebral disc of lumbar region 07/21/2015   Dysphagia 07/21/2015   Asthma, mild intermittent 07/21/2015   Acid reflux 07/21/2015   Personal history of other malignant neoplasm of skin 11/22/2012    Past Surgical History:  Procedure Laterality Date   BACK SURGERY  2010   lumbar surgery 1989 and Spinal  stimulator in 2010   CERVICAL FUSION     C5-T1   ELBOW SURGERY Right 11/2020   ulnar nerve transposition   ESOPHAGOGASTRODUODENOSCOPY  2015   normal   EXCISION/RELEASE BURSA HIP Right 10/2020   Left shoulder surgery      SHOULDER ARTHROSCOPY WITH ROTATOR CUFF REPAIR AND SUBACROMIAL DECOMPRESSION Right 08/26/2021   Procedure: SHOULDER ARTHROSCOPY WITH ROTATOR CUFF REPAIR AND SUBACROMIAL DECOMPRESSION;  Surgeon: Juanell Fairly, MD;  Location: [facility] ORS;  Service: Orthopedics;  Laterality: Right;    OB History   No obstetric history on file.      Home Medications    Prior to Admission medications   Medication Sig Start Date End Date Taking? Authorizing Provider  azithromycin (ZITHROMAX) 250 MG tablet Take 1 tablet (250 mg total) by mouth daily. Take first 2 tablets together, then 1 every day until finished. 10/28/23  Yes Jerric Oyen R, NP  benzonatate (TESSALON) 100 MG capsule Take 1 capsule (100 mg total) by mouth every 8 (eight) hours. 10/23/23  Yes Crystalle Popwell R, NP  predniSONE (STERAPRED UNI-PAK 21 TAB) 10 MG (21) TBPK tablet Take by mouth daily. Take 6 tabs by mouth daily  for 1 days, then 5 tabs for 1 days, then 4 tabs for 1 days, then 3 tabs for 1 days, 2 tabs for 1 days, then 1 tab by mouth daily for 1 days 10/23/23  Yes Tanielle Emigh R, NP  promethazine-dextromethorphan (PROMETHAZINE-DM) 6.25-15 MG/5ML syrup Take 5 mLs by mouth at bedtime  as needed. 10/23/23  Yes Lee Kalt, Elita Boone, NP  acetaminophen (TYLENOL) 500 MG tablet Take 500 mg by mouth every 6 (six) hours as needed.    [provider]  albuterol (ACCUNEB) 0.63 MG/3ML nebulizer solution Take 3 mLs (0.63 mg total) by nebulization every 6 (six) hours as needed for wheezing. 08/18/21   Sherlene Shams, MD  budesonide (PULMICORT) 180 MCG/ACT inhaler Inhale 2 puffs into the lungs 2 (two) times daily. 10/23/22   Sherlene Shams, MD  budesonide-formoterol (SYMBICORT) 160-4.5 MCG/ACT inhaler Inhale 2 puffs into the lungs 2  (two) times daily. 02/26/21   Reubin Milan, MD  cyclobenzaprine (FLEXERIL) 10 MG tablet TAKE 1 TABLET BY MOUTH THREE TIMES A DAY AS NEEDED FOR MUSCLE SPASMS 10/13/22   Sherlene Shams, MD  diazepam (VALIUM) 5 MG tablet Take 1 tablet (5 mg total) by mouth every 8 (eight) hours as needed for anxiety. 02/22/23   Sherlene Shams, MD  Diclofenac Potassium,Migraine, 50 MG PACK TAKE 50 MG BY MOUTH DAILY. 04/30/22   Worthy Rancher B, FNP  diclofenac Sodium (VOLTAREN) 1 % GEL Apply 4 g topically 4 (four) times daily. 01/10/20   Reubin Milan, MD  gabapentin (NEURONTIN) 100 MG capsule TAKE 1 CAPSULE (100 MG TOTAL) BY MOUTH THREE TIMES DAILY. 04/27/23   Sherlene Shams, MD  ipratropium (ATROVENT) 0.06 % nasal spray Place 2 sprays into both nostrils 4 (four) times daily. 02/20/23   Becky Augusta, NP  meclizine (ANTIVERT) 12.5 MG tablet Take 1 tablet (12.5 mg total) by mouth 3 (three) times daily as needed for dizziness. 02/20/23   Becky Augusta, NP  Misc Natural Products (IMMUNE TRIO PO) Take by mouth.    [provider]  rizatriptan (MAXALT-MLT) 10 MG disintegrating tablet TAKE 1 TABLET BY MOUTH AS NEEDED FOR MIGRAINE. MAY REPEAT IN 2 HOURS IF NEEDED 05/05/23   Sherlene Shams, MD  traZODone (DESYREL) 50 MG tablet TAKE 1/2 TO 1 TABLET BY MOUTH AT BEDTIME AS NEEDED FOR SLEEP 06/17/23   Sherlene Shams, MD  triamcinolone cream (KENALOG) 0.1 % Apply 1 application topically 2 (two) times daily. 04/21/19   Reubin Milan, MD    Family History Family History  Problem Relation Age of Onset   Diabetes Father    Renal cancer Father    Breast cancer Neg Hx     Social History Social History   Tobacco Use   Smoking status: Never   Smokeless tobacco: Never  Vaping Use   Vaping status: Never Used  Substance Use Topics   Alcohol use: Yes    Alcohol/week: 1.0 - 2.0 standard drink of alcohol    Types: 1 - 2 Glasses of wine per week    Comment: occasionally   Drug use: No     Allergies    Tape   Review of Systems Review of Systems   Physical Exam Triage Vital Signs ED Triage Vitals [10/23/23 0915]  Encounter Vitals Group     BP (!) 163/89     Systolic BP Percentile      Diastolic BP Percentile      Pulse Rate 73     Resp 18     Temp 98.7 F (37.1 C)     Temp Source Temporal     SpO2 98 %     Weight      Height      Head Circumference      Peak Flow      Pain  Score      Pain Loc      Pain Education      Exclude from Growth Chart    No data found.  Updated Vital Signs BP (!) 163/89 (BP Location: Left Arm)   Pulse 73   Temp 98.7 F (37.1 C) (Temporal)   Resp 18   SpO2 98%   Visual Acuity Right Eye Distance:   Left Eye Distance:   Bilateral Distance:    Right Eye Near:   Left Eye Near:    Bilateral Near:     Physical Exam Constitutional:      Appearance: Normal appearance.  HENT:     Right Ear: Tympanic membrane, ear canal and external ear normal.     Left Ear: Tympanic membrane, ear canal and external ear normal.     Nose: Congestion present. No rhinorrhea.     Mouth/Throat:     Pharynx: No posterior oropharyngeal erythema.  Eyes:     Extraocular Movements: Extraocular movements intact.  Cardiovascular:     Rate and Rhythm: Normal rate and regular rhythm.     Pulses: Normal pulses.     Heart sounds: Normal heart sounds.  Pulmonary:     Effort: Pulmonary effort is normal.     Breath sounds: Normal breath sounds.     Comments: Harsh barky cough witnessed Neurological:     Mental Status: She is alert and oriented to person, place, and time. Mental status is at baseline.      UC Treatments / Results  Labs (all labs ordered are listed, but only abnormal results are displayed) Labs Reviewed  POC RSV    EKG   Radiology No results found.  Procedures Procedures (including critical care time)  Medications Ordered in UC Medications - No data to display  Initial Impression / Assessment and Plan / UC Course  I have  reviewed the triage vital signs and the nursing notes.  Pertinent labs & imaging results that were available during my care of the patient were reviewed by me and considered in my medical decision making (see chart for details).  Viral URI with cough, exposure to RSV  Patient is in no signs of distress nor toxic appearing.  Vital signs are stable.  Low suspicion for pneumonia, pneumothorax or bronchitis and therefore will defer imaging.  RSV testing pending prescribed prednisone, Tessalon and Promethazine DM, watch wait antibiotic placed at pharmacy if no improvement seen.May use additional over-the-counter medications as needed for supportive care.  May follow-up with urgent care as needed if symptoms persist or worsen.     Final Clinical Impressions(s) / UC Diagnoses   Final diagnoses:  Viral URI with cough  Exposure to respiratory syncytial virus (RSV)     Discharge Instructions      Your symptoms today are most likely being caused by a virus and should steadily improve in time it can take up to 7 to 10 days before you truly start to see a turnaround however things will get better if no improvement seen by Thursday you may pick up azithromycin from the pharmacy for bacterial coverage  RSV test is pending, you will be notified of test results within the hour only if positive  In the meantime begin prednisone every morning with food as directed to open and relax the airway, should help with harshness of cough and wheezing  May use Tessalon pill taking 1 to 2 tablets every 8 hours and may use cough syrup at bedtime to help you  rest    You can take Tylenol and/or Ibuprofen as needed for fever reduction and pain relief.   For cough: honey 1/2 to 1 teaspoon (you can dilute the honey in water or another fluid).  You can also use guaifenesin and dextromethorphan for cough. You can use a humidifier for chest congestion and cough.  If you don't have a humidifier, you can sit in the bathroom  with the hot shower running.      For sore throat: try warm salt water gargles, cepacol lozenges, throat spray, warm tea or water with lemon/honey, popsicles or ice, or OTC cold relief medicine for throat discomfort.   For congestion: take a daily anti-histamine like Zyrtec, Claritin, and a oral decongestant, such as pseudoephedrine.  You can also use Flonase 1-2 sprays in each nostril daily.   It is important to stay hydrated: drink plenty of fluids (water, gatorade/powerade/pedialyte, juices, or teas) to keep your throat moisturized and help further relieve irritation/discomfort.    ED Prescriptions     Medication Sig Dispense Auth. Provider   predniSONE (STERAPRED UNI-PAK 21 TAB) 10 MG (21) TBPK tablet Take by mouth daily. Take 6 tabs by mouth daily  for 1 days, then 5 tabs for 1 days, then 4 tabs for 1 days, then 3 tabs for 1 days, 2 tabs for 1 days, then 1 tab by mouth daily for 1 days 21 tablet Marks Scalera R, NP   benzonatate (TESSALON) 100 MG capsule Take 1 capsule (100 mg total) by mouth every 8 (eight) hours. 21 capsule Silviano Neuser R, NP   promethazine-dextromethorphan (PROMETHAZINE-DM) 6.25-15 MG/5ML syrup Take 5 mLs by mouth at bedtime as needed. 118 mL Rudransh Bellanca R, NP   azithromycin (ZITHROMAX) 250 MG tablet Take 1 tablet (250 mg total) by mouth daily. Take first 2 tablets together, then 1 every day until finished. 6 tablet Valinda Hoar, NP      PDMP not reviewed this encounter.   Valinda Hoar, [place] 10/23/23 [phone]

## 2023-10-23 NOTE — Discharge Instructions (Addendum)
Your symptoms today are most likely being caused by a virus and should steadily improve in time it can take up to 7 to 10 days before you truly start to see a turnaround however things will get better if no improvement seen by Thursday you may pick up azithromycin from the pharmacy for bacterial coverage  RSV test is pending, you will be notified of test results within the hour only if positive  In the meantime begin prednisone every morning with food as directed to open and relax the airway, should help with harshness of cough and wheezing  May use Tessalon pill taking 1 to 2 tablets every 8 hours and may use cough syrup at bedtime to help you rest    You can take Tylenol and/or Ibuprofen as needed for fever reduction and pain relief.   For cough: honey 1/2 to 1 teaspoon (you can dilute the honey in water or another fluid).  You can also use guaifenesin and dextromethorphan for cough. You can use a humidifier for chest congestion and cough.  If you don't have a humidifier, you can sit in the bathroom with the hot shower running.      For sore throat: try warm salt water gargles, cepacol lozenges, throat spray, warm tea or water with lemon/honey, popsicles or ice, or OTC cold relief medicine for throat discomfort.   For congestion: take a daily anti-histamine like Zyrtec, Claritin, and a oral decongestant, such as pseudoephedrine.  You can also use Flonase 1-2 sprays in each nostril daily.   It is important to stay hydrated: drink plenty of fluids (water, gatorade/powerade/pedialyte, juices, or teas) to keep your throat moisturized and help further relieve irritation/discomfort.

## 2023-10-23 NOTE — ED Triage Notes (Signed)
Patient presents to UC for cough, HA, fatigue, and fever x 4 days. Exposed to RSV. Treating symptoms with tylenol. Hx of asthma.

## 2023-10-28 ENCOUNTER — Encounter: Payer: Self-pay | Admitting: Nurse Practitioner

## 2023-10-28 ENCOUNTER — Ambulatory Visit: Payer: Federal, State, Local not specified - PPO | Admitting: Nurse Practitioner

## 2023-10-28 ENCOUNTER — Ambulatory Visit: Payer: Federal, State, Local not specified - PPO

## 2023-10-28 VITALS — BP 122/76 | HR 96 | Temp 98.4°F | Ht 66.0 in | Wt 141.4 lb

## 2023-10-28 DIAGNOSIS — J101 Influenza due to other identified influenza virus with other respiratory manifestations: Secondary | ICD-10-CM

## 2023-10-28 DIAGNOSIS — R509 Fever, unspecified: Secondary | ICD-10-CM | POA: Diagnosis not present

## 2023-10-28 LAB — POCT INFLUENZA A/B
Influenza A, POC: POSITIVE — AB
Influenza B, POC: NEGATIVE

## 2023-10-28 MED ORDER — OSELTAMIVIR PHOSPHATE 75 MG PO CAPS: 75.0000 mg | ORAL_CAPSULE | Freq: Two times a day (BID) | ORAL | 0 refills | Status: AC

## 2023-10-28 MED ORDER — HYDROCODONE BIT-HOMATROP MBR 5-1.5 MG/5ML PO SOLN: 5.0000 mL | Freq: Every evening | ORAL | 0 refills | Status: AC | PRN

## 2023-10-28 MED ORDER — HYDROCODONE BIT-HOMATROP MBR 5-1.5 MG/5ML PO SOLN: 5.0000 mL | Freq: Every evening | ORAL | 0 refills | Status: DC | PRN

## 2023-10-28 NOTE — Progress Notes (Signed)
 Established Patient Office Visit  Subjective:  Patient ID: Kristin Watkins, female    DOB: 1956-05-18  Age: 68 y.o. MRN: [id number]  CC:  Chief Complaint  Patient presents with   Acute Visit   Cough    Cough, wheezing, SOB, right ear pressure and fever x 10 days   Discussed the use of a AI scribe software for clinical note transcription with the patient, who gave verbal consent to proceed.   HPI  Kristin Watkins presents for URI symptoms.Kristin Watkins is a 68 year old female with asthma who presents with worsening cough and associated symptoms.  She has experienced a worsening cough since her visit to urgent care on February 1st. She was prescribed prednisone  and Tessalon  Perles, with instructions to take azithromycin  if her symptoms did not improve, though she has not started the antibiotic yet. The cough is severe, causing vomiting and urinary incontinence. Tessalon  Perles, previously effective, are not providing relief currently. She completed a course of prednisone  today and uses her nebulizer once daily, which offers some relief.  Additional symptoms include a blocked ear sensation for the past couple of days, severe frontal headache, nasal congestion, and postnasal drip. She had a fever of 100F yesterday, which was reduced with Tylenol . 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 She is prone to sinus infections but notes this episode does not resemble her typical sinus infection.  She has tested negative for COVID-19 three times using home tests and was also tested for RSV, which was negative.  HPI   Past Medical History:  Diagnosis Date   Allergy    Asthma    Blood transfusion without reported diagnosis    Cancer (HCC)    skin ca   Chronic bronchitis (HCC)    Complication of anesthesia    Dyspnea    GERD (gastroesophageal reflux disease)    Migraines    PONV (postoperative  nausea and vomiting)     Past Surgical History:  Procedure Laterality Date   BACK SURGERY  2010   lumbar surgery 1989 and Spinal stimulator in 2010   CERVICAL FUSION     C5-T1   ELBOW SURGERY Right 11/2020   ulnar nerve transposition   ESOPHAGOGASTRODUODENOSCOPY  2015   normal   EXCISION/RELEASE BURSA HIP Right 10/2020   Left shoulder surgery      SHOULDER ARTHROSCOPY WITH ROTATOR CUFF REPAIR AND SUBACROMIAL DECOMPRESSION Right 08/26/2021   Procedure: SHOULDER ARTHROSCOPY WITH ROTATOR CUFF REPAIR AND SUBACROMIAL DECOMPRESSION;  Surgeon: Marchia Drivers, MD;  Location: [facility] OR[facility];  Service: Orthopedics;  Laterality: Right;    Family History  Problem Relation Age of Onset   Diabetes Father    Renal cancer Father    Breast cancer Neg Hx     Social History   Socioeconomic History   Marital status: Married    Spouse name: Not on file   Number of children: Not on file   Years of education: Not on file   Highest education level: Doctorate  Occupational History   Not on file  Tobacco Use   Smoking status: Never   Smokeless tobacco: Never  Vaping Use   Vaping status: Never Used  Substance and Sexual Activity   Alcohol use: Yes    Alcohol/week: 1.0 - 2.0 standard drink of alcohol    Types: 1 - 2 Glasses of wine per week    Comment: occasionally   Drug  use: No   Sexual activity: Yes  Other Topics Concern   Not on file  Social History Narrative   Not on file   Social Drivers of Health   Financial Resource Strain: Low Risk  (10/27/2023)   Overall Financial Resource Strain (CARDIA)    Difficulty of Paying Living Expenses: Not very hard  Food Insecurity: No Food Insecurity (10/27/2023)   Hunger Vital Sign    Worried About Running Out of Food in the Last Year: Never true    Ran Out of Food in the Last Year: Never true  Transportation Needs: No Transportation Needs (10/27/2023)   PRAPARE - Administrator, Civil Service (Medical): No    Lack of Transportation  (Non-Medical): No  Physical Activity: Sufficiently Active (10/27/2023)   Exercise Vital Sign    Days of Exercise per Week: 6 days    Minutes of Exercise per Session: 30 min  Stress: Stress Concern Present (10/27/2023)   [employer] of Occupational Health - Occupational Stress Questionnaire    Feeling of Stress : Rather much  Social Connections: Socially Isolated (10/27/2023)   Social Connection and Isolation Panel [NHANES]    Frequency of Communication with Friends and Family: Once a week    Frequency of Social Gatherings with Friends and Family: Once a week    Attends Religious Services: Never    Database Administrator or Organizations: No    Attends Engineer, Structural: Not on file    Marital Status: Married  Catering Manager Violence: Not on file     Outpatient Medications Prior to Visit  Medication Sig Dispense Refill   acetaminophen  (TYLENOL ) 500 MG tablet Take 500 mg by mouth every 6 (six) hours as needed.     albuterol  (ACCUNEB ) 0.63 MG/3ML nebulizer solution Take 3 mLs (0.63 mg total) by nebulization every 6 (six) hours as needed for wheezing. 75 mL 1   azithromycin  (ZITHROMAX ) 250 MG tablet Take 1 tablet (250 mg total) by mouth daily. Take first 2 tablets together, then 1 every day until finished. 6 tablet 0   benzonatate  (TESSALON ) 100 MG capsule Take 1 capsule (100 mg total) by mouth every 8 (eight) hours. 21 capsule 0   budesonide  (PULMICORT ) 180 MCG/ACT inhaler Inhale 2 puffs into the lungs 2 (two) times daily. 1 each 11   budesonide -formoterol  (SYMBICORT ) 160-4.5 MCG/ACT inhaler Inhale 2 puffs into the lungs 2 (two) times daily. 1 each 5   cyclobenzaprine  (FLEXERIL ) 10 MG tablet TAKE 1 TABLET BY MOUTH THREE TIMES A DAY AS NEEDED FOR MUSCLE SPASMS 90 tablet 3   diazepam  (VALIUM ) 5 MG tablet Take 1 tablet (5 mg total) by mouth every 8 (eight) hours as needed for anxiety. 30 tablet 0   Diclofenac  Potassium,Migraine, 50 MG PACK TAKE 50 MG BY MOUTH DAILY. 27 each 2    diclofenac  Sodium (VOLTAREN ) 1 % GEL Apply 4 g topically 4 (four) times daily. 150 g 2   gabapentin  (NEURONTIN ) 100 MG capsule TAKE 1 CAPSULE (100 MG TOTAL) BY MOUTH THREE TIMES DAILY. 90 capsule 5   ipratropium (ATROVENT ) 0.06 % nasal spray Place 2 sprays into both nostrils 4 (four) times daily. 15 mL 12   meclizine  (ANTIVERT ) 12.5 MG tablet Take 1 tablet (12.5 mg total) by mouth 3 (three) times daily as needed for dizziness. 30 tablet 0   Misc Natural Products (IMMUNE TRIO PO) Take by mouth.     predniSONE  (STERAPRED UNI-PAK 21 TAB) 10 MG (21) TBPK tablet Take by  mouth daily. Take 6 tabs by mouth daily  for 1 days, then 5 tabs for 1 days, then 4 tabs for 1 days, then 3 tabs for 1 days, 2 tabs for 1 days, then 1 tab by mouth daily for 1 days 21 tablet 0   promethazine -dextromethorphan (PROMETHAZINE -DM) 6.25-15 MG/5ML syrup Take 5 mLs by mouth at bedtime as needed. 118 mL 0   rizatriptan  (MAXALT -MLT) 10 MG disintegrating tablet TAKE 1 TABLET BY MOUTH AS NEEDED FOR MIGRAINE. MAY REPEAT IN 2 HOURS IF NEEDED 10 tablet 0   traZODone  (DESYREL ) 50 MG tablet TAKE 1/2 TO 1 TABLET BY MOUTH AT BEDTIME AS NEEDED FOR SLEEP 90 tablet 2   triamcinolone  cream (KENALOG ) 0.1 % Apply 1 application topically 2 (two) times daily. 80 g 0   No facility-administered medications prior to visit.    Allergies  Allergen Reactions   Tape Itching    Tegaderm tape give rash     ROS Review of Systems Negative unless indicated in HPI.    Objective:    Physical Exam Constitutional:      Appearance: Normal appearance.  HENT:     Right Ear: A middle ear effusion is present. Tympanic membrane is not erythematous.     Left Ear: A middle ear effusion is present. Tympanic membrane is not erythematous.     Nose:     Right Turbinates: Not enlarged.     Left Turbinates: Not enlarged.     Right Sinus: No frontal sinus tenderness.     Left Sinus: No frontal sinus tenderness.     Mouth/Throat:     Mouth: Mucous membranes  are moist.     Pharynx: Postnasal drip present. No pharyngeal swelling, oropharyngeal exudate or posterior oropharyngeal erythema.     Tonsils: No tonsillar exudate.  Cardiovascular:     Rate and Rhythm: Normal rate and regular rhythm.  Pulmonary:     Effort: Pulmonary effort is normal.     Breath sounds: Normal breath sounds. No stridor. No wheezing.  Neurological:     General: No focal deficit present.     Mental Status: She is alert and oriented to person, place, and time. Mental status is at baseline.  Psychiatric:        Mood and Affect: Mood normal.        Behavior: Behavior normal.        Thought Content: Thought content normal.        Judgment: Judgment normal.     BP 122/76   Pulse 96   Temp 98.4 F (36.9 C)   Ht 5' 6 (1.676 m)   Wt 141 lb 6.4 oz (64.1 kg)   SpO2 96%   BMI 22.82 kg/m  Wt Readings from Last 3 Encounters:  10/28/23 141 lb 6.4 oz (64.1 kg)  05/18/23 135 lb (61.2 kg)  02/22/23 143 lb 6.4 oz (65 kg)     Health Maintenance  Topic Date Due   Pneumonia Vaccine 53+ Years old (1 of 2 - PCV) Never done   Colonoscopy  Never done   Zoster Vaccines- Shingrix (1 of 2) Never done   MAMMOGRAM  07/03/2023   COVID-19 Vaccine (5 - 2024-25 season) 11/13/2023 (Originally 05/23/2023)   INFLUENZA VACCINE  12/20/2023 (Originally 04/22/2023)   DTaP/Tdap/Td (4 - Td or Tdap) 05/17/2033   DEXA SCAN  Completed   Hepatitis C Screening  Completed   HPV VACCINES  Aged Out    There are no preventive care reminders to display for  this patient.  Lab Results  Component Value Date   TSH 1.29 05/15/2022   Lab Results  Component Value Date   WBC 5.3 05/15/2022   HGB 13.0 05/15/2022   HCT 38.9 05/15/2022   MCV 86.2 05/15/2022   PLT 228.0 05/15/2022   Lab Results  Component Value Date   NA 139 05/15/2022   K 4.1 05/15/2022   CO2 30 05/15/2022   GLUCOSE 75 05/15/2022   BUN 18 05/15/2022   CREATININE 0.87 05/15/2022   BILITOT 0.6 05/15/2022   ALKPHOS 62 05/15/2022    AST 13 05/15/2022   ALT 9 05/15/2022   PROT 6.7 05/15/2022   ALBUMIN 4.5 05/15/2022   CALCIUM 9.5 05/15/2022   ANIONGAP 13 10/09/2011   GFR 70.20 05/15/2022   Lab Results  Component Value Date   CHOL 245 (H) 10/17/2021   Lab Results  Component Value Date   HDL 89.80 10/17/2021   Lab Results  Component Value Date   LDLCALC 138 (H) 10/17/2021   Lab Results  Component Value Date   TRIG 85.0 10/17/2021   Lab Results  Component Value Date   CHOLHDL 3 10/17/2021   Lab Results  Component Value Date   HGBA1C 6.2 04/16/2021      Assessment & Plan:  Influenza A Assessment & Plan: Worsening symptoms since urgent care visit on 10/23/2023. Persistent cough, postnasal drip, intermittent wheezing, and frontal headache. No sinus tenderness. Ear feels blocked but examination is normal. Fever of 100F on 10/27/2023. Negative home COVID tests and RSV test. -Tested positive for fluA. -We will treat with Tamiflu  twice a day for 5 days and Hycodan at bedtime. -Order chest x-ray to rule out pneumonia.  Orders: -     DG Chest 2 View  Fever, unspecified fever cause -     POCT Influenza A/B -     DG Chest 2 View  Other orders -     HYDROcodone  Bit-Homatrop MBr; Take 5 mLs by mouth at bedtime as needed for cough.  Dispense: 70 mL; Refill: 0 -     Oseltamivir  Phosphate; Take 1 capsule (75 mg total) by mouth 2 (two) times daily for 5 days.  Dispense: 10 capsule; Refill: 0    Follow-up: Return if symptoms worsen or fail to improve.   Sacha Topor, NP

## 2023-10-28 NOTE — Progress Notes (Signed)
 Please inform pt: The chest x-ray is clear.  No infection or mass seen on the x-ray.

## 2023-11-02 DIAGNOSIS — J101 Influenza due to other identified influenza virus with other respiratory manifestations: Secondary | ICD-10-CM | POA: Insufficient documentation

## 2023-11-02 NOTE — Assessment & Plan Note (Addendum)
Worsening symptoms since urgent care visit on 10/23/2023. Persistent cough, postnasal drip, intermittent wheezing, and frontal headache. No sinus tenderness. Ear feels blocked but examination is normal. Fever of 100F on 10/27/2023. Negative home COVID tests and RSV test. -Tested positive for fluA. -We will treat with Tamiflu twice a day for 5 days and Hycodan at bedtime. -Order chest x-ray to rule out pneumonia.

## 2024-01-15 ENCOUNTER — Other Ambulatory Visit: Payer: Self-pay | Admitting: Internal Medicine

## 2024-01-26 ENCOUNTER — Encounter (HOSPITAL_COMMUNITY): Payer: Self-pay

## 2024-02-02 ENCOUNTER — Ambulatory Visit: Admitting: Internal Medicine

## 2024-02-02 ENCOUNTER — Encounter: Payer: Self-pay | Admitting: Internal Medicine

## 2024-02-02 VITALS — BP 128/80 | HR 78 | Temp 97.6°F | Ht 66.0 in | Wt 146.6 lb

## 2024-02-02 DIAGNOSIS — Z0000 Encounter for general adult medical examination without abnormal findings: Secondary | ICD-10-CM

## 2024-02-02 DIAGNOSIS — R5383 Other fatigue: Secondary | ICD-10-CM

## 2024-02-02 DIAGNOSIS — J4520 Mild intermittent asthma, uncomplicated: Secondary | ICD-10-CM | POA: Diagnosis not present

## 2024-02-02 DIAGNOSIS — J40 Bronchitis, not specified as acute or chronic: Secondary | ICD-10-CM

## 2024-02-02 DIAGNOSIS — R7303 Prediabetes: Secondary | ICD-10-CM

## 2024-02-02 DIAGNOSIS — Z87898 Personal history of other specified conditions: Secondary | ICD-10-CM

## 2024-02-02 DIAGNOSIS — Z1211 Encounter for screening for malignant neoplasm of colon: Secondary | ICD-10-CM

## 2024-02-02 DIAGNOSIS — K219 Gastro-esophageal reflux disease without esophagitis: Secondary | ICD-10-CM | POA: Diagnosis not present

## 2024-02-02 DIAGNOSIS — Z85828 Personal history of other malignant neoplasm of skin: Secondary | ICD-10-CM

## 2024-02-02 DIAGNOSIS — Z23 Encounter for immunization: Secondary | ICD-10-CM

## 2024-02-02 DIAGNOSIS — E782 Mixed hyperlipidemia: Secondary | ICD-10-CM

## 2024-02-02 DIAGNOSIS — Z1231 Encounter for screening mammogram for malignant neoplasm of breast: Secondary | ICD-10-CM | POA: Diagnosis not present

## 2024-02-02 DIAGNOSIS — E559 Vitamin D deficiency, unspecified: Secondary | ICD-10-CM

## 2024-02-02 LAB — VITAMIN D 25 HYDROXY (VIT D DEFICIENCY, FRACTURES): VITD: 45.94 ng/mL (ref 30.00–100.00)

## 2024-02-02 LAB — COMPREHENSIVE METABOLIC PANEL WITH GFR
ALT: 13 U/L (ref 0–35)
AST: 16 U/L (ref 0–37)
Albumin: 4.6 g/dL (ref 3.5–5.2)
Alkaline Phosphatase: 59 U/L (ref 39–117)
BUN: 18 mg/dL (ref 6–23)
CO2: 27 meq/L (ref 19–32)
Calcium: 9.8 mg/dL (ref 8.4–10.5)
Chloride: 102 meq/L (ref 96–112)
Creatinine, Ser: 0.76 mg/dL (ref 0.40–1.20)
GFR: 81.58 mL/min (ref >60.00)
Glucose, Bld: 92 mg/dL (ref 70–99)
Potassium: 3.8 meq/L (ref 3.5–5.1)
Sodium: 139 meq/L (ref 135–145)
Total Bilirubin: 0.6 mg/dL (ref 0.2–1.2)
Total Protein: 7.2 g/dL (ref 6.0–8.3)

## 2024-02-02 LAB — CBC WITH DIFFERENTIAL/PLATELET
Basophils Absolute: 0 10*3/uL (ref 0.0–0.1)
Basophils Relative: 0.6 % (ref 0.0–3.0)
Eosinophils Absolute: 0.1 10*3/uL (ref 0.0–0.7)
Eosinophils Relative: 2.1 % (ref 0.0–5.0)
HCT: 42.7 % (ref 36.0–46.0)
Hemoglobin: 14.2 g/dL (ref 12.0–15.0)
Lymphocytes Relative: 24.9 % (ref 12.0–46.0)
Lymphs Abs: 1.3 10*3/uL (ref 0.7–4.0)
MCHC: 33.3 g/dL (ref 30.0–36.0)
MCV: 87.4 fl (ref 78.0–100.0)
Monocytes Absolute: 0.4 10*3/uL (ref 0.1–1.0)
Monocytes Relative: 6.7 % (ref 3.0–12.0)
Neutro Abs: 3.5 10*3/uL (ref 1.4–7.7)
Neutrophils Relative %: 65.7 % (ref 43.0–77.0)
Platelets: 237 10*3/uL (ref 150.0–400.0)
RBC: 4.88 Mil/uL (ref 3.87–5.11)
RDW: 14.1 % (ref 11.5–15.5)
WBC: 5.4 10*3/uL (ref 4.0–10.5)

## 2024-02-02 LAB — LDL CHOLESTEROL, DIRECT: Direct LDL: 144 mg/dL

## 2024-02-02 LAB — LIPID PANEL
Cholesterol: 251 mg/dL — ABNORMAL HIGH (ref 0–200)
HDL: 87 mg/dL (ref >39.00)
LDL Cholesterol: 146 mg/dL — ABNORMAL HIGH (ref 0–99)
NonHDL: 164.43
Total CHOL/HDL Ratio: 3
Triglycerides: 93 mg/dL (ref 0.0–149.0)
VLDL: 18.6 mg/dL (ref 0.0–40.0)

## 2024-02-02 LAB — HEMOGLOBIN A1C: Hgb A1c MFr Bld: 6.1 % (ref 4.6–6.5)

## 2024-02-02 LAB — TSH: TSH: 0.99 u[IU]/mL (ref 0.35–5.50)

## 2024-02-02 MED ORDER — AIRSUPRA 90-80 MCG/ACT IN AERO: 1.0000 | INHALATION_SPRAY | Freq: Four times a day (QID) | RESPIRATORY_TRACT | 11 refills | Status: AC | PRN

## 2024-02-02 MED ORDER — GABAPENTIN 100 MG PO CAPS: ORAL_CAPSULE | ORAL | 5 refills | Status: DC

## 2024-02-02 NOTE — Patient Instructions (Addendum)
 YOUR MAMMOGRAM has been ordered , PLEASE CALL [facility] - call [phone]  Tony Frederickson does  the scheduling for [facility] as well   )   You received the Prevnar 20 (pneumonia vaccine) today.   I will initiate the order for your colon cancer screening  Test, the one called  Cologuard.  It will be delivered to your house, and you will send off a stool sample in the envelope it provides.

## 2024-02-02 NOTE — Progress Notes (Addendum)
 Patient ID: Kristin Watkins, female    DOB: 1955/09/22  Age: 68 y.o. MRN: [id number]  The patient is here for annual preventive examination and management of other chronic and acute problems.   The risk factors are reflected in the social history.   The roster of all physicians providing medical care to patient - is listed in the Snapshot section of the chart.   Activities of daily living:  The patient is 100% independent in all ADLs: dressing, toileting, feeding as well as independent mobility   Home safety : The patient has smoke detectors in the home. They wear seatbelts.  There are no unsecured firearms at home. There is no violence in the home.    There is no risks for hepatitis, STDs or HIV. There is no   history of blood transfusion. They have no travel history to infectious disease endemic areas of the world.   The patient has seen their dentist in the last six month. They have seen their eye doctor in the last year. The patinet  denies slight hearing difficulty with regard to whispered voices and some television programs.  They have deferred audiologic testing in the last year.  They do not  have excessive sun exposure. Discussed the need for sun protection: hats, long sleeves and use of sunscreen if there is significant sun exposure.    Diet: the importance of a healthy diet is discussed. They do have a healthy diet.   The benefits of regular aerobic exercise were discussed. The patient  exercises  3 to 5 days per week  for  60 minutes.    Depression screen: there are no signs or vegative symptoms of depression- irritability, change in appetite, anhedonia, sadness/tearfullness.   The following portions of the patient's history were reviewed and updated as appropriate: allergies, current medications, past family history, past medical history,  past surgical history, past social history  and problem list.   Visual acuity was not assessed per patient preference since the patient has  regular follow up with an  ophthalmologist. Hearing and body mass index were assessed and reviewed.    During the course of the visit the patient was educated and counseled about appropriate screening and preventive services including : fall prevention , diabetes screening, nutrition counseling, colorectal cancer screening, and recommended immunizations.    Chief Complaint:   no new issues.     Review of Symptoms  Patient denies headache, fevers, malaise, unintentional weight loss, skin rash, eye pain, sinus congestion and sinus pain, sore throat, dysphagia,  hemoptysis , cough, dyspnea, wheezing, chest pain, palpitations, orthopnea, edema, abdominal pain, nausea, melena, diarrhea, constipation, flank pain, dysuria, hematuria, urinary  Frequency, nocturia, numbness, tingling, seizures,  Focal weakness, Loss of consciousness,  Tremor, insomnia, depression, anxiety, and suicidal ideation.    Physical Exam:  BP 128/80   Pulse 78   Temp 97.6 F (36.4 C)   Ht 5\' 6"  (1.676 m)   Wt 146 lb 9.6 oz (66.5 kg)   SpO2 96%   BMI 23.66 kg/m    Physical Exam Vitals reviewed.  Constitutional:      General: She is not in acute distress.    Appearance: Normal appearance. She is normal weight. She is not ill-appearing, toxic-appearing or diaphoretic.  HENT:     Head: Normocephalic.  Eyes:     General: No scleral icterus.       Right eye: No discharge.        Left eye: No discharge.  Conjunctiva/sclera: Conjunctivae normal.  Cardiovascular:     Rate and Rhythm: Normal rate and regular rhythm.     Heart sounds: Normal heart sounds.  Pulmonary:     Effort: Pulmonary effort is normal. No respiratory distress.     Breath sounds: Normal breath sounds.  Musculoskeletal:        General: Normal range of motion.  Skin:    General: Skin is warm and dry.  Neurological:     General: No focal deficit present.     Mental Status: She is alert and oriented to person, place, and time. Mental status is  at baseline.  Psychiatric:        Mood and Affect: Mood normal.        Behavior: Behavior normal.        Thought Content: Thought content normal.        Judgment: Judgment normal.    Assessment and Plan: Encounter for preventive health examination Assessment & Plan: age appropriate education and counseling updated, referrals for preventative services and immunizations addressed, dietary and smoking counseling addressed, most recent labs reviewed.  I have personally reviewed and have noted:   1) the patient's medical and social history 2) The pt's use of alcohol, tobacco, and illicit drugs 3) The patient's current medications and supplements 4) Functional ability including ADL's, fall risk, home safety risk, hearing and visual impairment 5) Diet and physical activities 6) Evidence for depression or mood disorder 7) The patient's height, weight, and BMI have been recorded in the chartrhythm.     I have made referrals, and provided counseling and education based on review of the above    Encounter for screening mammogram for malignant neoplasm of breast -     3D Screening Mammogram, Left and Right; Future  Pre-diabetes -     Comprehensive metabolic panel with GFR -     Hemoglobin A1c  Mixed hyperlipidemia Assessment & Plan: No treatment is needed .  her ten year risk remains low  At < 7% Lab Results  Component Value Date   CHOL 251 (H) 02/02/2024   HDL 87.00 02/02/2024   LDLCALC 146 (H) 02/02/2024   LDLDIRECT 144.0 02/02/2024   TRIG 93.0 02/02/2024   CHOLHDL 3 02/02/2024     Orders: -     Lipid panel -     LDL cholesterol, direct  Other fatigue -     CBC with Differential/Platelet -     TSH  Bronchitis  History of wheezing  Colon cancer screening -     Cologuard  Vitamin D  deficiency -     VITAMIN D  25 Hydroxy (Vit-D Deficiency, Fractures)  Need for pneumococcal 20-valent conjugate vaccination -     Pneumococcal conjugate vaccine 20-valent  Mild  intermittent asthma without complication Assessment & Plan: No recent exacerbations with reduction in maintenane meds from  ICS/LABA to ICS (Pulmicort ) .  Refillig albuterol  with airsupra    Personal history of other malignant neoplasm of skin Assessment & Plan: She continues to have semi annual derm follow up and uses skin protection when working outside    Gastroesophageal reflux disease without esophagitis Assessment & Plan: Managed with famotidine. The risks of long term PPI use for acid suppression in patients without documented Barretts esophagus were discussed, including the possibility of osteoporosis, iron , B12 and magnesium deficiencies,  CKD, and dementia. Suggested trial of pepcid 20 mg daily and if GERD symptoms return,  To resume daily PPI and  referral for EGD.  Other orders -     Gabapentin ; TAKE 1 CAPSULE (100 MG TOTAL) BY MOUTH THREE TIMES DAILY.  Dispense: 90 capsule; Refill: 5 -     Airsupra ; Inhale 1 puff into the lungs every 6 (six) hours as needed.  Dispense: 5.9 g; Refill: 11    No follow-ups on file.  Thersia Flax, MD

## 2024-02-03 ENCOUNTER — Encounter: Payer: Self-pay | Admitting: Internal Medicine

## 2024-02-03 DIAGNOSIS — Z0000 Encounter for general adult medical examination without abnormal findings: Secondary | ICD-10-CM | POA: Insufficient documentation

## 2024-02-03 NOTE — Assessment & Plan Note (Signed)

## 2024-02-03 NOTE — Assessment & Plan Note (Addendum)
 No treatment is needed .  her ten year risk remains low  At < 7% Lab Results  Component Value Date   CHOL 251 (H) 02/02/2024   HDL 87.00 02/02/2024   LDLCALC 146 (H) 02/02/2024   LDLDIRECT 144.0 02/02/2024   TRIG 93.0 02/02/2024   CHOLHDL 3 02/02/2024

## 2024-02-03 NOTE — Assessment & Plan Note (Addendum)
 No recent exacerbations with reduction in maintenane meds from  ICS/LABA to ICS (Pulmicort ) .  Refillig albuterol  with airsupra

## 2024-02-03 NOTE — Assessment & Plan Note (Signed)
 She continues to have semi annual derm follow up and uses skin protection when working outside

## 2024-02-03 NOTE — Assessment & Plan Note (Signed)
 Managed with famotidine. The risks of long term PPI use for acid suppression in patients without documented Barretts esophagus were discussed, including the possibility of osteoporosis, iron , B12 and magnesium deficiencies,  CKD, and dementia. Suggested trial of pepcid 20 mg daily and if GERD symptoms return,  To resume daily PPI and  referral for EGD.

## 2024-02-05 ENCOUNTER — Ambulatory Visit: Payer: Self-pay | Admitting: Internal Medicine

## 2024-02-22 LAB — COLOGUARD: COLOGUARD: NEGATIVE

## 2024-04-19 ENCOUNTER — Other Ambulatory Visit: Payer: Self-pay | Admitting: Internal Medicine

## 2024-05-02 ENCOUNTER — Ambulatory Visit
Admission: RE | Admit: 2024-05-02 | Discharge: 2024-05-02 | Disposition: A | Discharge status: Home / Self Care | Source: Ambulatory Visit | Attending: Internal Medicine | Admitting: Internal Medicine

## 2024-05-02 DIAGNOSIS — Z1231 Encounter for screening mammogram for malignant neoplasm of breast: Secondary | ICD-10-CM | POA: Diagnosis present

## 2024-07-14 ENCOUNTER — Other Ambulatory Visit: Payer: Self-pay | Admitting: Internal Medicine

## 2024-08-29 ENCOUNTER — Other Ambulatory Visit: Payer: Self-pay | Admitting: Internal Medicine

## 2024-08-29 DIAGNOSIS — G4484 Primary exertional headache: Secondary | ICD-10-CM

## 2024-08-29 MED ORDER — DICLOFENAC POTASSIUM(MIGRAINE) 50 MG PO PACK: PACK | ORAL | 2 refills | Status: AC

## 2024-10-07 ENCOUNTER — Other Ambulatory Visit: Payer: Self-pay | Admitting: Internal Medicine
